# Patient Record
Sex: Female | Born: 1955
Health system: Southern US, Community
[De-identification: ages and names within clinical notes are randomized; demographics above are authoritative.]

## PROBLEM LIST (undated history)

## (undated) DIAGNOSIS — K449 Diaphragmatic hernia without obstruction or gangrene: Secondary | ICD-10-CM

## (undated) DIAGNOSIS — N879 Dysplasia of cervix uteri, unspecified: Secondary | ICD-10-CM

## (undated) DIAGNOSIS — M8580 Other specified disorders of bone density and structure, unspecified site: Secondary | ICD-10-CM

## (undated) HISTORY — DX: Diaphragmatic hernia without obstruction or gangrene: K44.9

## (undated) HISTORY — PX: GYNECOLOGIC CRYOSURGERY: SHX857

## (undated) HISTORY — DX: Other specified disorders of bone density and structure, unspecified site: M85.80

## (undated) HISTORY — DX: Dysplasia of cervix uteri, unspecified: N87.9

---

## 1990-09-22 DIAGNOSIS — N879 Dysplasia of cervix uteri, unspecified: Secondary | ICD-10-CM

## 1990-09-22 HISTORY — PX: CERVICAL BIOPSY  W/ LOOP ELECTRODE EXCISION: SUR135

## 1990-09-22 HISTORY — DX: Dysplasia of cervix uteri, unspecified: N87.9

## 1998-05-25 ENCOUNTER — Other Ambulatory Visit: Admission: RE | Admit: 1998-05-25 | Discharge: 1998-05-25 | Payer: Self-pay | Admitting: Gynecology

## 1999-08-08 ENCOUNTER — Other Ambulatory Visit: Admission: RE | Admit: 1999-08-08 | Discharge: 1999-08-08 | Payer: Self-pay | Admitting: Gynecology

## 2001-10-21 ENCOUNTER — Other Ambulatory Visit: Admission: RE | Admit: 2001-10-21 | Discharge: 2001-10-21 | Payer: Self-pay | Admitting: Gynecology

## 2002-11-21 ENCOUNTER — Other Ambulatory Visit: Admission: RE | Admit: 2002-11-21 | Discharge: 2002-11-21 | Payer: Self-pay | Admitting: Gynecology

## 2004-06-19 ENCOUNTER — Other Ambulatory Visit: Admission: RE | Admit: 2004-06-19 | Discharge: 2004-06-19 | Payer: Self-pay | Admitting: Gynecology

## 2005-06-20 ENCOUNTER — Other Ambulatory Visit: Admission: RE | Admit: 2005-06-20 | Discharge: 2005-06-20 | Payer: Self-pay | Admitting: Gynecology

## 2006-07-13 ENCOUNTER — Other Ambulatory Visit: Admission: RE | Admit: 2006-07-13 | Discharge: 2006-07-13 | Payer: Self-pay | Admitting: Gynecology

## 2007-07-21 ENCOUNTER — Other Ambulatory Visit: Admission: RE | Admit: 2007-07-21 | Discharge: 2007-07-21 | Payer: Self-pay | Admitting: Gynecology

## 2008-07-28 ENCOUNTER — Other Ambulatory Visit: Admission: RE | Admit: 2008-07-28 | Discharge: 2008-07-28 | Payer: Self-pay | Admitting: Gynecology

## 2008-07-28 ENCOUNTER — Encounter: Payer: Self-pay | Admitting: Gynecology

## 2008-07-28 ENCOUNTER — Ambulatory Visit: Payer: Self-pay | Admitting: Gynecology

## 2009-10-08 ENCOUNTER — Other Ambulatory Visit: Admission: RE | Admit: 2009-10-08 | Discharge: 2009-10-08 | Payer: Self-pay | Admitting: Gynecology

## 2009-10-08 ENCOUNTER — Ambulatory Visit: Payer: Self-pay | Admitting: Gynecology

## 2011-01-23 ENCOUNTER — Encounter (INDEPENDENT_AMBULATORY_CARE_PROVIDER_SITE_OTHER): Payer: BC Managed Care – PPO | Admitting: Gynecology

## 2011-01-23 ENCOUNTER — Other Ambulatory Visit (HOSPITAL_COMMUNITY)
Admission: RE | Admit: 2011-01-23 | Discharge: 2011-01-23 | Disposition: A | Discharge status: Home / Self Care | Payer: BC Managed Care – PPO | Source: Ambulatory Visit | Attending: Gynecology | Admitting: Gynecology

## 2011-01-23 ENCOUNTER — Other Ambulatory Visit: Payer: Self-pay | Admitting: Gynecology

## 2011-01-23 DIAGNOSIS — R823 Hemoglobinuria: Secondary | ICD-10-CM

## 2011-01-23 DIAGNOSIS — Z833 Family history of diabetes mellitus: Secondary | ICD-10-CM

## 2011-01-23 DIAGNOSIS — Z13220 Encounter for screening for lipoid disorders: Secondary | ICD-10-CM

## 2011-01-23 DIAGNOSIS — Z124 Encounter for screening for malignant neoplasm of cervix: Secondary | ICD-10-CM | POA: Insufficient documentation

## 2011-01-23 DIAGNOSIS — Z01419 Encounter for gynecological examination (general) (routine) without abnormal findings: Secondary | ICD-10-CM

## 2011-01-30 ENCOUNTER — Encounter (INDEPENDENT_AMBULATORY_CARE_PROVIDER_SITE_OTHER): Payer: BC Managed Care – PPO

## 2011-01-30 DIAGNOSIS — M899 Disorder of bone, unspecified: Secondary | ICD-10-CM

## 2011-01-30 DIAGNOSIS — M949 Disorder of cartilage, unspecified: Secondary | ICD-10-CM

## 2011-12-05 ENCOUNTER — Encounter: Payer: Self-pay | Admitting: Gynecology

## 2012-01-20 ENCOUNTER — Encounter: Payer: Self-pay | Admitting: Obstetrics and Gynecology

## 2012-01-20 DIAGNOSIS — M8580 Other specified disorders of bone density and structure, unspecified site: Secondary | ICD-10-CM | POA: Insufficient documentation

## 2012-01-26 ENCOUNTER — Encounter: Payer: Self-pay | Admitting: Gynecology

## 2012-01-26 ENCOUNTER — Ambulatory Visit (INDEPENDENT_AMBULATORY_CARE_PROVIDER_SITE_OTHER): Payer: BC Managed Care – PPO | Admitting: Gynecology

## 2012-01-26 VITALS — BP 116/62 | Ht 67.0 in | Wt 136.0 lb

## 2012-01-26 DIAGNOSIS — M949 Disorder of cartilage, unspecified: Secondary | ICD-10-CM

## 2012-01-26 DIAGNOSIS — Z13220 Encounter for screening for lipoid disorders: Secondary | ICD-10-CM

## 2012-01-26 DIAGNOSIS — M8580 Other specified disorders of bone density and structure, unspecified site: Secondary | ICD-10-CM

## 2012-01-26 DIAGNOSIS — Z01419 Encounter for gynecological examination (general) (routine) without abnormal findings: Secondary | ICD-10-CM

## 2012-01-26 DIAGNOSIS — Z131 Encounter for screening for diabetes mellitus: Secondary | ICD-10-CM

## 2012-01-26 DIAGNOSIS — IMO0002 Reserved for concepts with insufficient information to code with codable children: Secondary | ICD-10-CM

## 2012-01-26 DIAGNOSIS — M899 Disorder of bone, unspecified: Secondary | ICD-10-CM

## 2012-01-26 DIAGNOSIS — N952 Postmenopausal atrophic vaginitis: Secondary | ICD-10-CM

## 2012-01-26 LAB — COMPREHENSIVE METABOLIC PANEL
ALT: 17 U/L (ref 0–35)
AST: 24 U/L (ref 0–37)
Albumin: 4.2 g/dL (ref 3.5–5.2)
Alkaline Phosphatase: 66 U/L (ref 39–117)
BUN: 18 mg/dL (ref 6–23)
CO2: 30 mEq/L (ref 19–32)
Calcium: 9.3 mg/dL (ref 8.4–10.5)
Chloride: 104 mEq/L (ref 96–112)
Creat: 0.7 mg/dL (ref 0.50–1.10)
Glucose, Bld: 102 mg/dL — ABNORMAL HIGH (ref 70–99)
Potassium: 4.1 mEq/L (ref 3.5–5.3)
Sodium: 140 mEq/L (ref 135–145)
Total Bilirubin: 0.6 mg/dL (ref 0.3–1.2)
Total Protein: 6.6 g/dL (ref 6.0–8.3)

## 2012-01-26 LAB — LIPID PANEL
Cholesterol: 162 mg/dL (ref 0–200)
HDL: 68 mg/dL (ref >39)
LDL Cholesterol: 82 mg/dL (ref 0–99)
Total CHOL/HDL Ratio: 2.4 Ratio
Triglycerides: 60 mg/dL (ref <150)
VLDL: 12 mg/dL (ref 0–40)

## 2012-01-26 LAB — CBC WITH DIFFERENTIAL/PLATELET
Basophils Absolute: 0 10*3/uL (ref 0.0–0.1)
Basophils Relative: 0 % (ref 0–1)
Eosinophils Absolute: 0.2 10*3/uL (ref 0.0–0.7)
Eosinophils Relative: 3 % (ref 0–5)
HCT: 38 % (ref 36.0–46.0)
Hemoglobin: 11.8 g/dL — ABNORMAL LOW (ref 12.0–15.0)
Lymphocytes Relative: 30 % (ref 12–46)
Lymphs Abs: 2.1 10*3/uL (ref 0.7–4.0)
MCH: 26.1 pg (ref 26.0–34.0)
MCHC: 31.1 g/dL (ref 30.0–36.0)
MCV: 84.1 fL (ref 78.0–100.0)
Monocytes Absolute: 0.6 10*3/uL (ref 0.1–1.0)
Monocytes Relative: 8 % (ref 3–12)
Neutro Abs: 4.2 10*3/uL (ref 1.7–7.7)
Neutrophils Relative %: 59 % (ref 43–77)
Platelets: 301 10*3/uL (ref 150–400)
RBC: 4.52 MIL/uL (ref 3.87–5.11)
RDW: 14 % (ref 11.5–15.5)
WBC: 7.1 10*3/uL (ref 4.0–10.5)

## 2012-01-26 NOTE — Patient Instructions (Signed)
Arrange colonoscopy and possible endoscopy per our discussion. Follow up in one year for annual gynecologic exam. Follow up sooner if discomfort with intercourse continues.

## 2012-01-26 NOTE — Progress Notes (Signed)
Ashley Schultz [birth year] [id number]        56 y.o.  for annual exam.  Several issues noted below.  Past medical history,surgical history, medications, allergies, family history and social history were all reviewed and documented in the EPIC chart. ROS:  Was performed and pertinent positives and negatives are included in the history.  Exam: [facility] chaperone present Filed Vitals:   01/26/12 0903  BP: 116/62   General appearance  Normal Skin grossly normal Head/Neck normal with no cervical or supraclavicular adenopathy thyroid normal Lungs  clear Cardiac RR, without RMG Abdominal  soft, nontender, without masses, organomegaly or hernia Breasts  examined lying and sitting without masses, retractions, discharge or axillary adenopathy. Pelvic  Ext/BUS/vagina  normal with atrophic changes  Cervix  normal with atrophic changes  Uterus  anteverted, normal size, shape and contour, midline and mobile nontender   Adnexa  Without masses or tenderness    Anus and perineum  normal   Rectovaginal  normal sphincter tone without palpated masses or tenderness.    Assessment/Plan:  56 y.o. female for annual exam.    1. Postmenopausal.  Patient was having hot flashes/night sweats last year when she came for her annual exam. We discussed options and she was going to try Vivelle/Prometrium but ultimately decided against this and never started it. Said that her hot flashes are manageable. I reviewed the options of OTC soy based products and she can go ahead and try these. I again discussed the issues of HRT to include the WHI study with the risks of stroke, heart attack, DVT as well as breast cancer risk. Lowest dose for shortest period of time recommendations both from ACOG and NAMS reviewed. She is having some dyspareunia. Options for this to include lubricants, moisturizers such as Replens as well as vaginal estrogen treatment reviewed. The absorption issue discussed with availability of Vagifem to  minimize absorption. Patient does not want to start this at this time. She wants to try lubricants. If this does not help then she'll try Vagifem accepting the possibilities of low level absorption, uterine stimulation as well as the WHI risks if significant absorption. 2. Pap smear. 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 LEEP pathology showed CIN-1 with clear margins. Her Pap smears have all been negative since then on an annual basis. Her last Pap smear 2012. I discussed current screening guidelines in no Pap smear was done today and we'll plan on less frequent screening at a 3-5 year interval. 3. Mammography. Patient had her mammogram this year. She'll continue with annual mammography. SBE monthly reviewed. 4. Osteopenia. DEXA 2012 showed T score -1.2. Increase calcium vitamin D reviewed. We'll check vitamin D level today. Plan repeat DEXA next year. 5. Colonoscopy. Recommend screening colonoscopy. She is in the process of arranging this. She does have some dysphagia with eating and I do recommend she discuss this with her gastroenterologist before her colonoscopy in the event that they want to do an endoscopy at the same time. She agrees to arrange this also. 6. Health maintenance. Baseline CBC lipid profile competence metabolic panel urinalysis and hepatitis C screen based on age per CDC recommendation ordered.  Assuming she does well from a gynecologic standpoint she will see me in a year, sooner as needed.    Dara Lords MD, 9:42 AM 01/26/2012

## 2012-01-27 LAB — URINALYSIS W MICROSCOPIC + REFLEX CULTURE
Bacteria, UA: NONE SEEN
Bilirubin Urine: NEGATIVE
Casts: NONE SEEN
Crystals: NONE SEEN
Glucose, UA: NEGATIVE mg/dL
Hgb urine dipstick: NEGATIVE
Ketones, ur: NEGATIVE mg/dL
Leukocytes, UA: NEGATIVE
Nitrite: NEGATIVE
Protein, ur: NEGATIVE mg/dL
Specific Gravity, Urine: 1.016 (ref 1.005–1.030)
Squamous Epithelial / LPF: NONE SEEN
Urobilinogen, UA: 0.2 mg/dL (ref 0.0–1.0)
pH: 6 (ref 5.0–8.0)

## 2012-01-27 LAB — HEPATITIS C ANTIBODY: HCV Ab: NEGATIVE

## 2012-01-27 LAB — VITAMIN D 25 HYDROXY (VIT D DEFICIENCY, FRACTURES): Vit D, 25-Hydroxy: 46 ng/mL (ref 30–89)

## 2013-03-09 ENCOUNTER — Encounter: Payer: Self-pay | Admitting: Gynecology

## 2013-04-07 ENCOUNTER — Encounter: Payer: Self-pay | Admitting: Gynecology

## 2013-04-07 ENCOUNTER — Ambulatory Visit (INDEPENDENT_AMBULATORY_CARE_PROVIDER_SITE_OTHER): Payer: BC Managed Care – PPO | Admitting: Gynecology

## 2013-04-07 VITALS — BP 112/74 | Ht 67.0 in | Wt 142.0 lb

## 2013-04-07 DIAGNOSIS — N951 Menopausal and female climacteric states: Secondary | ICD-10-CM

## 2013-04-07 DIAGNOSIS — Z01419 Encounter for gynecological examination (general) (routine) without abnormal findings: Secondary | ICD-10-CM

## 2013-04-07 DIAGNOSIS — M899 Disorder of bone, unspecified: Secondary | ICD-10-CM

## 2013-04-07 DIAGNOSIS — M949 Disorder of cartilage, unspecified: Secondary | ICD-10-CM

## 2013-04-07 DIAGNOSIS — M8580 Other specified disorders of bone density and structure, unspecified site: Secondary | ICD-10-CM

## 2013-04-07 DIAGNOSIS — Z13220 Encounter for screening for lipoid disorders: Secondary | ICD-10-CM

## 2013-04-07 MED ORDER — PROGESTERONE MICRONIZED 100 MG PO CAPS: 100.0000 mg | ORAL_CAPSULE | Freq: Every day | ORAL | Status: DC

## 2013-04-07 MED ORDER — ESTRADIOL 0.05 MG/24HR TD PTTW: 1.0000 | MEDICATED_PATCH | TRANSDERMAL | Status: DC

## 2013-04-07 NOTE — Patient Instructions (Signed)
Followup for bone density as scheduled. Start on hormone replacement as we discussed. Call if any issues or questions. Report any vaginal bleeding.

## 2013-04-07 NOTE — Progress Notes (Signed)
Ashley Schultz [birth year] [id number]        57 y.o.  [id number] for annual exam.  Several issues noted below.  Past medical history,surgical history, medications, allergies, family history and social history were all reviewed and documented in the EPIC chart.  ROS:  Performed and pertinent positives and negatives are included in the history, assessment and plan .  Exam: [facility] assistant Filed Vitals:   04/07/13 1208  BP: 112/74  Height: 5\' 7"  (1.702 m)  Weight: 142 lb (64.411 kg)   General appearance  Normal Skin grossly normal Head/Neck normal with no cervical or supraclavicular adenopathy thyroid normal Lungs  clear Cardiac RR, without RMG Abdominal  soft, nontender, without masses, organomegaly or hernia Breasts  examined lying and sitting without masses, retractions, discharge or axillary adenopathy. Pelvic  Ext/BUS/vagina  normal with mild atrophic changes.  Cervix  normal   Uterus  anteverted, normal size, shape and contour, midline and mobile nontender   Adnexa  Without masses or tenderness    Anus and perineum  normal   Rectovaginal  normal sphincter tone without palpated masses or tenderness.    Assessment/Plan:  57 y.o. [id number] female for annual exam.   1. Menopausal symptoms. Patient is continuing to struggle with hot flashes night sweats vaginal dryness. We talked about HRT multiple times and she now decides that she wants to go ahead and try this.  I again reviewed the whole issue of HRT with her to include the WHI study with increased risk of stroke, heart attack, DVT and breast cancer. The ACOG and NAMS statements for lowest dose for the shortest period of time reviewed. Transdermal versus oral first-pass effect benefit discussed. We will initiate with Minivelle 0.05 mg patches and Prometrium 100 mg nightly. She'll followup if she has any issues. She knows to report any vaginal bleeding. 2. Osteopenia. DEXA 01/2011 with T score -1.2. FRAX 4.4%/0.3%. Repeat DEXA now for  2 points on the curve. Increase calcium vitamin D reviewed. 3. Mammography 02/2013. Continued annual mammography. 4. Pap smear 2012. No Pap smear done today. History of LEEP with CIN-1 1992 with normal Pap smears since then. Plan Pap smear next year 3 year interval. 5. Colonoscopy 2013. Followup are recommended interval. 6. Health maintenance. Will check baseline CBC comprehensive metabolic panel lipid profile urinalysis vitamin D TSH. Follow up for bone density as scheduled.  Note: This document was prepared with digital dictation and possible smart phrase technology. Any transcriptional errors that result from this process are unintentional.   Dara Lords MD, 12:33 PM 04/07/2013

## 2013-04-08 LAB — CBC WITH DIFFERENTIAL/PLATELET
Basophils Absolute: 0 10*3/uL (ref 0.0–0.1)
Basophils Relative: 0 % (ref 0–1)
Eosinophils Absolute: 0.1 10*3/uL (ref 0.0–0.7)
Eosinophils Relative: 2 % (ref 0–5)
HCT: 36.6 % (ref 36.0–46.0)
Hemoglobin: 11.8 g/dL — ABNORMAL LOW (ref 12.0–15.0)
Lymphocytes Relative: 44 % (ref 12–46)
Lymphs Abs: 2.6 10*3/uL (ref 0.7–4.0)
MCH: 26.6 pg (ref 26.0–34.0)
MCHC: 32.2 g/dL (ref 30.0–36.0)
MCV: 82.4 fL (ref 78.0–100.0)
Monocytes Absolute: 0.5 10*3/uL (ref 0.1–1.0)
Monocytes Relative: 9 % (ref 3–12)
Neutro Abs: 2.6 10*3/uL (ref 1.7–7.7)
Neutrophils Relative %: 45 % (ref 43–77)
Platelets: 308 10*3/uL (ref 150–400)
RBC: 4.44 MIL/uL (ref 3.87–5.11)
RDW: 13.8 % (ref 11.5–15.5)
WBC: 5.9 10*3/uL (ref 4.0–10.5)

## 2013-04-08 LAB — LIPID PANEL
Cholesterol: 155 mg/dL (ref 0–200)
HDL: 70 mg/dL (ref >39)
LDL Cholesterol: 72 mg/dL (ref 0–99)
Total CHOL/HDL Ratio: 2.2 Ratio
Triglycerides: 64 mg/dL (ref <150)
VLDL: 13 mg/dL (ref 0–40)

## 2013-04-08 LAB — URINALYSIS W MICROSCOPIC + REFLEX CULTURE
Bacteria, UA: NONE SEEN
Bilirubin Urine: NEGATIVE
Casts: NONE SEEN
Glucose, UA: NEGATIVE mg/dL
Hgb urine dipstick: NEGATIVE
Ketones, ur: NEGATIVE mg/dL
Leukocytes, UA: NEGATIVE
Nitrite: NEGATIVE
Protein, ur: NEGATIVE mg/dL
Specific Gravity, Urine: 1.014 (ref 1.005–1.030)
Squamous Epithelial / LPF: NONE SEEN
Urobilinogen, UA: 0.2 mg/dL (ref 0.0–1.0)
pH: 6.5 (ref 5.0–8.0)

## 2013-04-08 LAB — COMPREHENSIVE METABOLIC PANEL
ALT: 13 U/L (ref 0–35)
AST: 17 U/L (ref 0–37)
Albumin: 4.4 g/dL (ref 3.5–5.2)
Alkaline Phosphatase: 80 U/L (ref 39–117)
BUN: 12 mg/dL (ref 6–23)
CO2: 29 mEq/L (ref 19–32)
Calcium: 9.8 mg/dL (ref 8.4–10.5)
Chloride: 103 mEq/L (ref 96–112)
Creat: 0.73 mg/dL (ref 0.50–1.10)
Glucose, Bld: 90 mg/dL (ref 70–99)
Potassium: 4.1 mEq/L (ref 3.5–5.3)
Sodium: 137 mEq/L (ref 135–145)
Total Bilirubin: 0.4 mg/dL (ref 0.3–1.2)
Total Protein: 6.5 g/dL (ref 6.0–8.3)

## 2013-04-08 LAB — VITAMIN D 25 HYDROXY (VIT D DEFICIENCY, FRACTURES): Vit D, 25-Hydroxy: 56 ng/mL (ref 30–89)

## 2013-04-08 LAB — TSH: TSH: 2.715 u[IU]/mL (ref 0.350–4.500)

## 2013-04-22 DIAGNOSIS — M8580 Other specified disorders of bone density and structure, unspecified site: Secondary | ICD-10-CM

## 2013-04-22 HISTORY — DX: Other specified disorders of bone density and structure, unspecified site: M85.80

## 2013-04-29 ENCOUNTER — Ambulatory Visit (INDEPENDENT_AMBULATORY_CARE_PROVIDER_SITE_OTHER): Payer: BC Managed Care – PPO | Admitting: Women's Health

## 2013-04-29 DIAGNOSIS — N898 Other specified noninflammatory disorders of vagina: Secondary | ICD-10-CM

## 2013-04-29 DIAGNOSIS — R3915 Urgency of urination: Secondary | ICD-10-CM

## 2013-04-29 DIAGNOSIS — N390 Urinary tract infection, site not specified: Secondary | ICD-10-CM

## 2013-04-29 LAB — URINALYSIS W MICROSCOPIC + REFLEX CULTURE
Bilirubin Urine: NEGATIVE
Casts: NONE SEEN
Glucose, UA: NEGATIVE mg/dL
Ketones, ur: NEGATIVE mg/dL
Leukocytes, UA: NEGATIVE
Nitrite: NEGATIVE
Protein, ur: NEGATIVE mg/dL
Specific Gravity, Urine: >1.03 — ABNORMAL HIGH (ref 1.005–1.030)
Urobilinogen, UA: 0.2 mg/dL (ref 0.0–1.0)
WBC, UA: NONE SEEN WBC/hpf (ref <3)
pH: 6 (ref 5.0–8.0)

## 2013-04-29 LAB — WET PREP FOR TRICH, YEAST, CLUE
Clue Cells Wet Prep HPF POC: NONE SEEN
Trich, Wet Prep: NONE SEEN
WBC, Wet Prep HPF POC: NONE SEEN
Yeast Wet Prep HPF POC: NONE SEEN

## 2013-04-29 MED ORDER — CIPROFLOXACIN HCL 250 MG PO TABS: 250.0000 mg | ORAL_TABLET | Freq: Two times a day (BID) | ORAL | Status: DC

## 2013-04-29 NOTE — Progress Notes (Signed)
Patient ID: Ashley Schultz, female   DOB: 03-Aug-1956, 57 y.o.   MRN: [id number] Presents with complaint of increased urinary frequency, urgency with mild bloating sensation. Denies a fever. Postmenopausal with numerous symptoms, started on HRT minivelle patch and Prometrium 100 at bedtime 3 weeks ago and has had better rest, no bleeding. Denies vaginal discharge, odor, itching. Husband recently had bypass surgery, is doing better but has been under increased stress. 45 year old daughter is struggling, making poor choices.  Exam: Appears well,  Tearful, no CVAT, external genitalia within normal limits, speculum exam scant white discharge with no noted erythema, wet prep negative. UA positive for moderate blood, 21-50 RBCs and rare bacteria.  Probable UTI with hematuria  Plan: Cipro 250 twice daily for 3 days. Check test of cure UA in 2 weeks. Counseling encouraged for situational stressors.

## 2013-04-29 NOTE — Patient Instructions (Addendum)
Urinary Tract Infection  Urinary tract infections (UTIs) can develop anywhere along your urinary tract. Your urinary tract is your body's drainage system for removing wastes and extra water. Your urinary tract includes two kidneys, two ureters, a bladder, and a urethra. Your kidneys are a pair of bean-shaped organs. Each kidney is about the size of your fist. They are located below your ribs, one on each side of your spine.  CAUSES  Infections are caused by microbes, which are microscopic organisms, including fungi, viruses, and bacteria. These organisms are so small that they can only be seen through a microscope. Bacteria are the microbes that most commonly cause UTIs.  SYMPTOMS   Symptoms of UTIs may vary by age and gender of the patient and by the location of the infection. Symptoms in young women typically include a frequent and intense urge to urinate and a painful, burning feeling in the bladder or urethra during urination. Older women and men are more likely to be tired, shaky, and weak and have muscle aches and abdominal pain. A fever may mean the infection is in your kidneys. Other symptoms of a kidney infection include pain in your back or sides below the ribs, nausea, and vomiting.  DIAGNOSIS  To diagnose a UTI, your caregiver will ask you about your symptoms. Your caregiver also will ask to provide a urine sample. The urine sample will be tested for bacteria and white blood cells. White blood cells are made by your body to help fight infection.  TREATMENT   Typically, UTIs can be treated with medication. Because most UTIs are caused by a bacterial infection, they usually can be treated with the use of antibiotics. The choice of antibiotic and length of treatment depend on your symptoms and the type of bacteria causing your infection.  HOME CARE INSTRUCTIONS   If you were prescribed antibiotics, take them exactly as your caregiver instructs you. Finish the medication even if you feel better after you  have only taken some of the medication.   Drink enough water and fluids to keep your urine clear or pale yellow.   Avoid caffeine, tea, and carbonated beverages. They tend to irritate your bladder.   Empty your bladder often. Avoid holding urine for long periods of time.   Empty your bladder before and after sexual intercourse.   After a bowel movement, women should cleanse from front to back. Use each tissue only once.  SEEK MEDICAL CARE IF:    You have back pain.   You develop a fever.   Your symptoms do not begin to resolve within 3 days.  SEEK IMMEDIATE MEDICAL CARE IF:    You have severe back pain or lower abdominal pain.   You develop chills.   You have nausea or vomiting.   You have continued burning or discomfort with urination.  MAKE SURE YOU:    Understand these instructions.   Will watch your condition.   Will get help right away if you are not doing well or get worse.  Document Released: 06/18/2005 Document Revised: 03/09/2012 Document Reviewed: 10/17/2011  Exit[employer] Patient Information 2014 [employer].

## 2013-04-30 LAB — URINE CULTURE
Colony Count: NO GROWTH
Organism ID, Bacteria: NO GROWTH

## 2013-05-13 ENCOUNTER — Other Ambulatory Visit: Payer: Self-pay | Admitting: *Deleted

## 2013-05-13 ENCOUNTER — Other Ambulatory Visit: Payer: BC Managed Care – PPO

## 2013-05-13 DIAGNOSIS — N390 Urinary tract infection, site not specified: Secondary | ICD-10-CM

## 2013-05-14 LAB — URINALYSIS W MICROSCOPIC + REFLEX CULTURE
Bacteria, UA: NONE SEEN
Bilirubin Urine: NEGATIVE
Casts: NONE SEEN
Glucose, UA: NEGATIVE mg/dL
Hgb urine dipstick: NEGATIVE
Ketones, ur: NEGATIVE mg/dL
Leukocytes, UA: NEGATIVE
Nitrite: NEGATIVE
Protein, ur: NEGATIVE mg/dL
Specific Gravity, Urine: 1.014 (ref 1.005–1.030)
Squamous Epithelial / LPF: NONE SEEN
Urobilinogen, UA: 0.2 mg/dL (ref 0.0–1.0)
pH: 7 (ref 5.0–8.0)

## 2013-05-19 ENCOUNTER — Ambulatory Visit (INDEPENDENT_AMBULATORY_CARE_PROVIDER_SITE_OTHER): Payer: BC Managed Care – PPO

## 2013-05-19 ENCOUNTER — Encounter: Payer: Self-pay | Admitting: Gynecology

## 2013-05-19 DIAGNOSIS — M8580 Other specified disorders of bone density and structure, unspecified site: Secondary | ICD-10-CM

## 2013-05-19 DIAGNOSIS — M899 Disorder of bone, unspecified: Secondary | ICD-10-CM

## 2013-11-07 ENCOUNTER — Telehealth: Payer: Self-pay

## 2013-11-07 NOTE — Telephone Encounter (Signed)
Patient said her mom had heart attack and is scheduled for open heart surgery on Thurs. Meantime her Mom diagnosed with H1N1 flu and now patient with a few slight symptoms. She said they recommended she get Tamiflu since around her mom. She wondered if you would call this in for her to start asap?

## 2013-11-07 NOTE — Telephone Encounter (Signed)
I did call patient back and let her know Dr. Velvet BatheF has left the office and not sure if/when he will receive this message. Patient might should go to Urgent Care Walk [facility] and let them prescribe.  Her cell phone is [phone].

## 2013-11-09 ENCOUNTER — Other Ambulatory Visit: Payer: Self-pay | Admitting: Gynecology

## 2013-11-09 MED ORDER — OSELTAMIVIR PHOSPHATE 75 MG PO CAPS: 75.0000 mg | ORAL_CAPSULE | Freq: Every day | ORAL | Status: DC

## 2013-11-09 NOTE — Telephone Encounter (Signed)
I called patient back. She is well and not having sx. She would like to get Rx called in since she was around her mom on Saturday and she needs to stay well. Rx sent.

## 2013-11-09 NOTE — Telephone Encounter (Signed)
If patient is having no symptoms at all and she can take Tamiflu 75 mg daily x10 days as a prophylaxis.

## 2013-11-09 NOTE — Telephone Encounter (Signed)
I closed this in error.   What should I recommend to patient today as this call was on Monday evening. Too late for Tamiflu?

## 2014-03-29 ENCOUNTER — Encounter: Payer: Self-pay | Admitting: Gynecology

## 2014-06-08 ENCOUNTER — Other Ambulatory Visit (HOSPITAL_COMMUNITY)
Admission: RE | Admit: 2014-06-08 | Discharge: 2014-06-08 | Disposition: A | Discharge status: Home / Self Care | Payer: BC Managed Care – PPO | Source: Ambulatory Visit | Attending: Gynecology | Admitting: Gynecology

## 2014-06-08 ENCOUNTER — Ambulatory Visit (INDEPENDENT_AMBULATORY_CARE_PROVIDER_SITE_OTHER): Payer: BC Managed Care – PPO | Admitting: Gynecology

## 2014-06-08 ENCOUNTER — Encounter: Payer: Self-pay | Admitting: Gynecology

## 2014-06-08 VITALS — BP 120/76 | Ht 67.5 in | Wt 147.0 lb

## 2014-06-08 DIAGNOSIS — Z01419 Encounter for gynecological examination (general) (routine) without abnormal findings: Secondary | ICD-10-CM

## 2014-06-08 DIAGNOSIS — M8580 Other specified disorders of bone density and structure, unspecified site: Secondary | ICD-10-CM

## 2014-06-08 DIAGNOSIS — Z1151 Encounter for screening for human papillomavirus (HPV): Secondary | ICD-10-CM | POA: Diagnosis present

## 2014-06-08 DIAGNOSIS — M949 Disorder of cartilage, unspecified: Secondary | ICD-10-CM

## 2014-06-08 DIAGNOSIS — M899 Disorder of bone, unspecified: Secondary | ICD-10-CM

## 2014-06-08 DIAGNOSIS — N952 Postmenopausal atrophic vaginitis: Secondary | ICD-10-CM

## 2014-06-08 LAB — LIPID PANEL
Cholesterol: 176 mg/dL (ref 0–200)
HDL: 70 mg/dL (ref >39)
LDL Cholesterol: 93 mg/dL (ref 0–99)
Total CHOL/HDL Ratio: 2.5 Ratio
Triglycerides: 66 mg/dL (ref <150)
VLDL: 13 mg/dL (ref 0–40)

## 2014-06-08 LAB — CBC WITH DIFFERENTIAL/PLATELET
Basophils Absolute: 0 10*3/uL (ref 0.0–0.1)
Basophils Relative: 0 % (ref 0–1)
Eosinophils Absolute: 0.2 10*3/uL (ref 0.0–0.7)
Eosinophils Relative: 3 % (ref 0–5)
HCT: 39.2 % (ref 36.0–46.0)
Hemoglobin: 12.8 g/dL (ref 12.0–15.0)
Lymphocytes Relative: 40 % (ref 12–46)
Lymphs Abs: 2.5 10*3/uL (ref 0.7–4.0)
MCH: 27.1 pg (ref 26.0–34.0)
MCHC: 32.7 g/dL (ref 30.0–36.0)
MCV: 82.9 fL (ref 78.0–100.0)
Monocytes Absolute: 0.7 10*3/uL (ref 0.1–1.0)
Monocytes Relative: 11 % (ref 3–12)
Neutro Abs: 2.9 10*3/uL (ref 1.7–7.7)
Neutrophils Relative %: 46 % (ref 43–77)
Platelets: 332 10*3/uL (ref 150–400)
RBC: 4.73 MIL/uL (ref 3.87–5.11)
RDW: 13.7 % (ref 11.5–15.5)
WBC: 6.3 10*3/uL (ref 4.0–10.5)

## 2014-06-08 LAB — COMPREHENSIVE METABOLIC PANEL
ALT: 19 U/L (ref 0–35)
AST: 23 U/L (ref 0–37)
Albumin: 4.4 g/dL (ref 3.5–5.2)
Alkaline Phosphatase: 69 U/L (ref 39–117)
BUN: 18 mg/dL (ref 6–23)
CO2: 27 mEq/L (ref 19–32)
Calcium: 9.4 mg/dL (ref 8.4–10.5)
Chloride: 100 mEq/L (ref 96–112)
Creat: 0.66 mg/dL (ref 0.50–1.10)
Glucose, Bld: 85 mg/dL (ref 70–99)
Potassium: 4.8 mEq/L (ref 3.5–5.3)
Sodium: 136 mEq/L (ref 135–145)
Total Bilirubin: 0.4 mg/dL (ref 0.2–1.2)
Total Protein: 7 g/dL (ref 6.0–8.3)

## 2014-06-08 NOTE — Progress Notes (Signed)
Ashley Schultz [birth year] [id number]        58 y.o.  [id number] for annual exam.  Several issues noted below.  Past medical history,surgical history, problem list, medications, allergies, family history and social history were all reviewed and documented as reviewed in the EPIC chart.  ROS:  12 system ROS performed with pertinent positives and negatives included in the history, assessment and plan.   Additional significant findings :  none   Exam: [facility] person Vitals:   06/08/14 1005  BP: 120/76  Height: 5' 7.5" (1.715 m)  Weight: 147 lb (66.679 kg)   General appearance:  Normal affect, orientation and appearance. Skin: Grossly normal HEENT: Without gross lesions.  No cervical or supraclavicular adenopathy. Thyroid normal.  Lungs:  Clear without wheezing, rales or rhonchi Cardiac: RR, without RMG Abdominal:  Soft, nontender, without masses, guarding, rebound, organomegaly or hernia Breasts:  Examined lying and sitting without masses, retractions, discharge or axillary adenopathy. Pelvic:  Ext/BUS/vagina with generalized atrophic changes  Cervix atrophic. Pap/HPV  Uterus anteverted, normal size, shape and contour, midline and mobile nontender   Adnexa  Without masses or tenderness    Anus and perineum  Normal   Rectovaginal  Normal sphincter tone without palpated masses or tenderness.    Assessment/Plan:  58 y.o. [id number] female for annual exam.   1. Postmenopausal/atrophic genital changes. Had transiently started HRT discontinued because she decided against it. She is having some menopausal symptoms but tolerating them and does not want to consider HRT. She is having issues as far as dyspareunia due to atrophic changes and is using lubricants which again is tolerated. Alternatives to include vaginal estrogen/Osphena rejected. No vaginal bleeding. Continue to monitor. Follow up if wants to rediscuss treatment options otherwise report any vaginal bleeding. 2. Osteopenia.   DEXA 04/2013 T score -1.2 FRAX 5.6%/0.4%. Stable from prior DEXA. Plan repeat in several years. Increased calcium vitamin D reviewed.  Check vitamin D level today. 3. Pap smear 2012. Pap/HPV today. 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 Plan repeat Pap smear at 3-5 year interval current screening guidelines of normal. 4. Mammography 03/2014. Continue with annual mammography. SBE monthly reviewed. 5. Colonoscopy 2013. Repeat at their recommended interval. 6. Health maintenance. Baseline CBC comprehensive metabolic panel lipid profile urinalysis TSH vitamin D ordered. Follow up one year, sooner as needed.     Dara Lords MD, 10:34 AM 06/08/2014

## 2014-06-08 NOTE — Addendum Note (Signed)
Addended by: Dayna Barker on: 06/08/2014 10:39 AM   Modules accepted: Orders

## 2014-06-08 NOTE — Patient Instructions (Signed)
You may obtain a copy of any labs that were done today by logging onto MyChart as outlined in the instructions provided with your AVS (after visit summary). The office will not call with normal lab results but certainly if there are any significant abnormalities then we will contact you.   Health Maintenance, Female A healthy lifestyle and preventative care can promote health and wellness.  Maintain regular health, dental, and eye exams.  Eat a healthy diet. Foods like vegetables, fruits, whole grains, low-fat dairy products, and lean protein foods contain the nutrients you need without too many calories. Decrease your intake of foods high in solid fats, added sugars, and salt. Get information about a proper diet from your caregiver, if necessary.  Regular physical exercise is one of the most important things you can do for your health. Most adults should get at least 150 minutes of moderate-intensity exercise (any activity that increases your heart rate and causes you to sweat) each week. In addition, most adults need muscle-strengthening exercises on 2 or more days a week.   Maintain a healthy weight. The body mass index (BMI) is a screening tool to identify possible weight problems. It provides an estimate of body fat based on height and weight. Your caregiver can help determine your BMI, and can help you achieve or maintain a healthy weight. For adults 20 years and older:  A BMI below 18.5 is considered underweight.  A BMI of 18.5 to 24.9 is normal.  A BMI of 25 to 29.9 is considered overweight.  A BMI of 30 and above is considered obese.  Maintain normal blood lipids and cholesterol by exercising and minimizing your intake of saturated fat. Eat a balanced diet with plenty of fruits and vegetables. Blood tests for lipids and cholesterol should begin at age 61 and be repeated every 5 years. If your lipid or cholesterol levels are high, you are over 50, or you are a high risk for heart  disease, you may need your cholesterol levels checked more frequently.Ongoing high lipid and cholesterol levels should be treated with medicines if diet and exercise are not effective.  If you smoke, find out from your caregiver how to quit. If you do not use tobacco, do not start.  Lung cancer screening is recommended for adults aged 33 80 years who are at high risk for developing lung cancer because of a history of smoking. Yearly low-dose computed tomography (CT) is recommended for people who have at least a 30-pack-year history of smoking and are a current smoker or have quit within the past 15 years. A pack year of smoking is smoking an average of 1 pack of cigarettes a day for 1 year (for example: 1 pack a day for 30 years or 2 packs a day for 15 years). Yearly screening should continue until the smoker has stopped smoking for at least 15 years. Yearly screening should also be stopped for people who develop a health problem that would prevent them from having lung cancer treatment.  If you are pregnant, do not drink alcohol. If you are breastfeeding, be very cautious about drinking alcohol. If you are not pregnant and choose to drink alcohol, do not exceed 1 drink per day. One drink is considered to be 12 ounces (355 mL) of beer, 5 ounces (148 mL) of wine, or 1.5 ounces (44 mL) of liquor.  Avoid use of street drugs. Do not share needles with anyone. Ask for help if you need support or instructions about stopping  the use of drugs.  High blood pressure causes heart disease and increases the risk of stroke. Blood pressure should be checked at least every 1 to 2 years. Ongoing high blood pressure should be treated with medicines, if weight loss and exercise are not effective.  If you are 59 to 56 years old, ask your caregiver if you should take aspirin to prevent strokes.  Diabetes screening involves taking a blood sample to check your fasting blood sugar level. This should be done once every 3  years, after age 91, if you are within normal weight and without risk factors for diabetes. Testing should be considered at a younger age or be carried out more frequently if you are overweight and have at least 1 risk factor for diabetes.  Breast cancer screening is essential preventative care for women. You should practice "breast self-awareness." This means understanding the normal appearance and feel of your breasts and may include breast self-examination. Any changes detected, no matter how small, should be reported to a caregiver. Women in their 66s and 30s should have a clinical breast exam (CBE) by a caregiver as part of a regular health exam every 1 to 3 years. After age 101, women should have a CBE every year. Starting at age 100, women should consider having a mammogram (breast X-ray) every year. Women who have a family history of breast cancer should talk to their caregiver about genetic screening. Women at a high risk of breast cancer should talk to their caregiver about having an MRI and a mammogram every year.  Breast cancer gene (BRCA)-related cancer risk assessment is recommended for women who have family members with BRCA-related cancers. BRCA-related cancers include breast, ovarian, tubal, and peritoneal cancers. Having family members with these cancers may be associated with an increased risk for harmful changes (mutations) in the breast cancer genes BRCA1 and BRCA2. Results of the assessment will determine the need for genetic counseling and BRCA1 and BRCA2 testing.  The Pap test is a screening test for cervical cancer. Women should have a Pap test starting at age 57. Between ages 25 and 35, Pap tests should be repeated every 2 years. Beginning at age 37, you should have a Pap test every 3 years as long as the past 3 Pap tests have been normal. If you had a hysterectomy for a problem that was not cancer or a condition that could lead to cancer, then you no longer need Pap tests. If you are  between ages 50 and 76, and you have had normal Pap tests going back 10 years, you no longer need Pap tests. If you have had past treatment for cervical cancer or a condition that could lead to cancer, you need Pap tests and screening for cancer for at least 20 years after your treatment. If Pap tests have been discontinued, risk factors (such as a new sexual partner) need to be reassessed to determine if screening should be resumed. Some women have medical problems that increase the chance of getting cervical cancer. In these cases, your caregiver may recommend more frequent screening and Pap tests.  The human papillomavirus (HPV) test is an additional test that may be used for cervical cancer screening. The HPV test looks for the virus that can cause the cell changes on the cervix. The cells collected during the Pap test can be tested for HPV. The HPV test could be used to screen women aged 44 years and older, and should be used in women of any age  who have unclear Pap test results. After the age of 30, women should have HPV testing at the same frequency as a Pap test.  Colorectal cancer can be detected and often prevented. Most routine colorectal cancer screening begins at the age of 50 and continues through age 75. However, your caregiver may recommend screening at an earlier age if you have risk factors for colon cancer. On a yearly basis, your caregiver may provide home test kits to check for hidden blood in the stool. Use of a small camera at the end of a tube, to directly examine the colon (sigmoidoscopy or colonoscopy), can detect the earliest forms of colorectal cancer. Talk to your caregiver about this at age 50, when routine screening begins. Direct examination of the colon should be repeated every 5 to 10 years through age 75, unless early forms of pre-cancerous polyps or small growths are found.  Hepatitis C blood testing is recommended for all people born from 1945 through 1965 and any  individual with known risks for hepatitis C.  Practice safe sex. Use condoms and avoid high-risk sexual practices to reduce the spread of sexually transmitted infections (STIs). Sexually active women aged 25 and younger should be checked for Chlamydia, which is a common sexually transmitted infection. Older women with new or multiple partners should also be tested for Chlamydia. Testing for other STIs is recommended if you are sexually active and at increased risk.  Osteoporosis is a disease in which the bones lose minerals and strength with aging. This can result in serious bone fractures. The risk of osteoporosis can be identified using a bone density scan. Women ages 65 and over and women at risk for fractures or osteoporosis should discuss screening with their caregivers. Ask your caregiver whether you should be taking a calcium supplement or vitamin D to reduce the rate of osteoporosis.  Menopause can be associated with physical symptoms and risks. Hormone replacement therapy is available to decrease symptoms and risks. You should talk to your caregiver about whether hormone replacement therapy is right for you.  Use sunscreen. Apply sunscreen liberally and repeatedly throughout the day. You should seek shade when your shadow is shorter than you. Protect yourself by wearing long sleeves, pants, a wide-brimmed hat, and sunglasses year round, whenever you are outdoors.  Notify your caregiver of new moles or changes in moles, especially if there is a change in shape or color. Also notify your caregiver if a mole is larger than the size of a pencil eraser.  Stay current with your immunizations. Document Released: 03/24/2011 Document Revised: 01/03/2013 Document Reviewed: 03/24/2011 [employer] Patient Information 2014 [employer].   

## 2014-06-09 LAB — URINALYSIS W MICROSCOPIC + REFLEX CULTURE
Bilirubin Urine: NEGATIVE
Casts: NONE SEEN
Crystals: NONE SEEN
Glucose, UA: NEGATIVE mg/dL
Hgb urine dipstick: NEGATIVE
Ketones, ur: NEGATIVE mg/dL
Nitrite: NEGATIVE
Protein, ur: NEGATIVE mg/dL
Specific Gravity, Urine: 1.019 (ref 1.005–1.030)
Squamous Epithelial / LPF: NONE SEEN
Urobilinogen, UA: 0.2 mg/dL (ref 0.0–1.0)
pH: 7 (ref 5.0–8.0)

## 2014-06-09 LAB — CYTOLOGY - PAP

## 2014-06-09 LAB — TSH: TSH: 2.265 u[IU]/mL (ref 0.350–4.500)

## 2014-06-09 LAB — VITAMIN D 25 HYDROXY (VIT D DEFICIENCY, FRACTURES): Vit D, 25-Hydroxy: 48 ng/mL (ref 30–89)

## 2014-06-11 LAB — URINE CULTURE: Colony Count: 75000

## 2014-06-12 ENCOUNTER — Other Ambulatory Visit: Payer: Self-pay | Admitting: Gynecology

## 2014-06-12 MED ORDER — SULFAMETHOXAZOLE-TMP DS 800-160 MG PO TABS: 1.0000 | ORAL_TABLET | Freq: Two times a day (BID) | ORAL | Status: DC

## 2014-07-18 ENCOUNTER — Encounter: Payer: BC Managed Care – PPO | Admitting: Gynecology

## 2014-07-20 ENCOUNTER — Encounter: Payer: Self-pay | Admitting: Gynecology

## 2014-07-20 ENCOUNTER — Ambulatory Visit (INDEPENDENT_AMBULATORY_CARE_PROVIDER_SITE_OTHER): Payer: BC Managed Care – PPO | Admitting: Gynecology

## 2014-07-20 DIAGNOSIS — Z23 Encounter for immunization: Secondary | ICD-10-CM

## 2014-07-20 DIAGNOSIS — N898 Other specified noninflammatory disorders of vagina: Secondary | ICD-10-CM

## 2014-07-20 LAB — URINALYSIS W MICROSCOPIC + REFLEX CULTURE
Bilirubin Urine: NEGATIVE
Casts: NONE SEEN
Crystals: NONE SEEN
Glucose, UA: NEGATIVE mg/dL
Ketones, ur: NEGATIVE mg/dL
Leukocytes, UA: NEGATIVE
Nitrite: NEGATIVE
Protein, ur: NEGATIVE mg/dL
Specific Gravity, Urine: 1.015 (ref 1.005–1.030)
Urobilinogen, UA: 0.2 mg/dL (ref 0.0–1.0)
WBC, UA: NONE SEEN WBC/hpf (ref <3)
pH: 7.5 (ref 5.0–8.0)

## 2014-07-20 LAB — WET PREP FOR TRICH, YEAST, CLUE
Clue Cells Wet Prep HPF POC: NONE SEEN
Trich, Wet Prep: NONE SEEN
Yeast Wet Prep HPF POC: NONE SEEN

## 2014-07-20 MED ORDER — FLUCONAZOLE 150 MG PO TABS: 150.0000 mg | ORAL_TABLET | Freq: Once | ORAL | Status: DC

## 2014-07-20 NOTE — Addendum Note (Signed)
Addended by: Dayna BarkerGARDNER, KIMBERLY K on: 07/20/2014 03:27 PM   Modules accepted: Orders

## 2014-07-20 NOTE — Progress Notes (Signed)
Ashley Schultz [birth year] [id number]        58 y.o.  [id number] Presents with several days of vaginal itching." Around the opening to the vagina. No significant discharge odor. Recently treated for low level asymptomatic bacteriuria. Not having any frequency dysuria urgency or low back pain.  Past medical history,surgical history, problem list, medications, allergies, family history and social history were all reviewed and documented in the EPIC chart.  Directed ROS with pertinent positives and negatives documented in the history of present illness/assessment and plan.  Exam: Kim assistant General appearance:  Normal Spine without CVA tenderness Abdomen soft nontender without masses guarding rebound Pelvic external BS vagina with atrophic changes, scant discharge. Cervix normal. Uterus normal size and mobile nontender. Adnexa without masses or tenderness  Assessment/Plan:  58 y.o. [id number] with vulvar irritation times several days. Exam shows some atrophic changes but no significant discharge. Wet prep is negative. We'll cover for low-level yeast with Diflucan 150 mg 1 dose. Urinalysis is negative but will follow up with culture just to make sure that her asymptomatic bacteriuria has cleared. Follow up if symptoms persist, worsen or recur.     Dara LordsFONTAINE,Tj Kitchings P MD, 3:05 PM 07/20/2014

## 2014-07-20 NOTE — Patient Instructions (Signed)
Take the one Diflucan pill. Call if your symptoms persist. 

## 2014-07-22 LAB — URINE CULTURE
Colony Count: NO GROWTH
Organism ID, Bacteria: NO GROWTH

## 2014-07-24 ENCOUNTER — Encounter: Payer: Self-pay | Admitting: Gynecology

## 2015-07-30 ENCOUNTER — Encounter: Payer: Self-pay | Admitting: Gynecology

## 2015-08-27 ENCOUNTER — Encounter: Payer: Self-pay | Admitting: Gynecology

## 2015-08-27 ENCOUNTER — Ambulatory Visit (INDEPENDENT_AMBULATORY_CARE_PROVIDER_SITE_OTHER): Payer: 59 | Admitting: Gynecology

## 2015-08-27 VITALS — BP 120/70 | Ht 67.0 in | Wt 150.0 lb

## 2015-08-27 DIAGNOSIS — Z01419 Encounter for gynecological examination (general) (routine) without abnormal findings: Secondary | ICD-10-CM | POA: Diagnosis not present

## 2015-08-27 DIAGNOSIS — Z1321 Encounter for screening for nutritional disorder: Secondary | ICD-10-CM

## 2015-08-27 DIAGNOSIS — Z1329 Encounter for screening for other suspected endocrine disorder: Secondary | ICD-10-CM

## 2015-08-27 DIAGNOSIS — Z13220 Encounter for screening for lipoid disorders: Secondary | ICD-10-CM | POA: Diagnosis not present

## 2015-08-27 LAB — COMPREHENSIVE METABOLIC PANEL
ALT: 14 U/L (ref 6–29)
AST: 17 U/L (ref 10–35)
Albumin: 4.3 g/dL (ref 3.6–5.1)
Alkaline Phosphatase: 75 U/L (ref 33–130)
BUN: 14 mg/dL (ref 7–25)
CO2: 31 mmol/L (ref 20–31)
Calcium: 9.2 mg/dL (ref 8.6–10.4)
Chloride: 98 mmol/L (ref 98–110)
Creat: 0.7 mg/dL (ref 0.50–1.05)
Glucose, Bld: 88 mg/dL (ref 65–99)
Potassium: 4.1 mmol/L (ref 3.5–5.3)
Sodium: 137 mmol/L (ref 135–146)
Total Bilirubin: 0.5 mg/dL (ref 0.2–1.2)
Total Protein: 6.7 g/dL (ref 6.1–8.1)

## 2015-08-27 LAB — CBC WITH DIFFERENTIAL/PLATELET
Basophils Absolute: 0 10*3/uL (ref 0.0–0.1)
Basophils Relative: 0 % (ref 0–1)
Eosinophils Absolute: 0.1 10*3/uL (ref 0.0–0.7)
Eosinophils Relative: 2 % (ref 0–5)
HCT: 39.5 % (ref 36.0–46.0)
Hemoglobin: 12.6 g/dL (ref 12.0–15.0)
Lymphocytes Relative: 40 % (ref 12–46)
Lymphs Abs: 2.4 10*3/uL (ref 0.7–4.0)
MCH: 26.6 pg (ref 26.0–34.0)
MCHC: 31.9 g/dL (ref 30.0–36.0)
MCV: 83.3 fL (ref 78.0–100.0)
MPV: 9 fL (ref 8.6–12.4)
Monocytes Absolute: 0.7 10*3/uL (ref 0.1–1.0)
Monocytes Relative: 11 % (ref 3–12)
Neutro Abs: 2.9 10*3/uL (ref 1.7–7.7)
Neutrophils Relative %: 47 % (ref 43–77)
Platelets: 390 10*3/uL (ref 150–400)
RBC: 4.74 MIL/uL (ref 3.87–5.11)
RDW: 13.6 % (ref 11.5–15.5)
WBC: 6.1 10*3/uL (ref 4.0–10.5)

## 2015-08-27 LAB — LIPID PANEL
Cholesterol: 162 mg/dL (ref 125–200)
HDL: 77 mg/dL (ref >=46)
LDL Cholesterol: 61 mg/dL (ref <130)
Total CHOL/HDL Ratio: 2.1 Ratio (ref <=5.0)
Triglycerides: 119 mg/dL (ref <150)
VLDL: 24 mg/dL (ref <30)

## 2015-08-27 LAB — TSH: TSH: 2.373 u[IU]/mL (ref 0.350–4.500)

## 2015-08-27 NOTE — Patient Instructions (Signed)

## 2015-08-27 NOTE — Progress Notes (Signed)
Ashley Schultz 1955-12-11 [id number]        59 y.o.  [id number]  No LMP recorded. Patient is postmenopausal. for annual exam. Doing well without complaints.  Past medical history,surgical history, problem list, medications, allergies, family history and social history were all reviewed and documented as reviewed in the EPIC chart.  ROS:  Performed with pertinent positives and negatives included in the history, assessment and plan.   Additional significant findings :  none   Exam: [facility] personassistant Filed Vitals:   08/27/15 1438  BP: 120/70  Height: 5\' 7"  (1.702 m)  Weight: 150 lb (68.04 kg)   General appearance:  Normal affect, orientation and appearance. Skin: Grossly normal HEENT: Without gross lesions.  No cervical or supraclavicular adenopathy. Thyroid normal.  Lungs:  Clear without wheezing, rales or rhonchi Cardiac: RR, without RMG Abdominal:  Soft, nontender, without masses, guarding, rebound, organomegaly or hernia Breasts:  Examined lying and sitting without masses, retractions, discharge or axillary adenopathy. Pelvic:  Ext/BUS/vagina normal with mild atrophic changes  Cervix normal with mild atrophic changes  Uterus anteverted, normal size, shape and contour, midline and mobile nontender   Adnexa  Without masses or tenderness    Anus and perineum  Normal   Rectovaginal  Normal sphincter tone without palpated masses or tenderness.    Assessment/Plan:  59 y.o. [name] female for annual exam.   1. Postmenopausal/mild atrophic changes. Without significant polyposis, night sweats, vaginal dryness or any vaginal bleeding. Continue to monitor and report any vaginal bleeding. 2. Osteopenia. DEXA 2014 T score -1.2 FRAX 5.6%/0.4%. Stable from prior DEXA. Check vitamin D level today. Plan repeat DEXA in several years. 3. Pap smear/HPV 2015 negative. No Pap smear done today.  History of LEEP for LGSIL 1992. Normal Pap smears since then. 4. Mammography 07/2015. Continue with annual  mammography. SBE monthly review. 5. Colonoscopy 2013. Repeat at their recommended interval. 6. Health maintenance. Baseline CBC, comprehensive metabolic panel, lipid profile, urinalysis, TSH, vitamin D ordered. Follow up in one year, sooner as needed.   Dara LordsFONTAINE,TIMOTHY P MD, 3:07 PM 08/27/2015

## 2015-08-28 LAB — URINALYSIS W MICROSCOPIC + REFLEX CULTURE
Bacteria, UA: NONE SEEN [HPF]
Bilirubin Urine: NEGATIVE
Casts: NONE SEEN [LPF]
Glucose, UA: NEGATIVE
Hgb urine dipstick: NEGATIVE
Ketones, ur: NEGATIVE
Leukocytes, UA: NEGATIVE
Nitrite: NEGATIVE
Protein, ur: NEGATIVE
RBC / HPF: NONE SEEN RBC/HPF (ref <=2)
Specific Gravity, Urine: 1.013 (ref 1.001–1.035)
Squamous Epithelial / LPF: NONE SEEN [HPF] (ref <=5)
WBC, UA: NONE SEEN WBC/HPF (ref <=5)
Yeast: NONE SEEN [HPF]
pH: 6.5 (ref 5.0–8.0)

## 2015-08-28 LAB — VITAMIN D 25 HYDROXY (VIT D DEFICIENCY, FRACTURES): Vit D, 25-Hydroxy: 34 ng/mL (ref 30–100)

## 2015-09-20 ENCOUNTER — Encounter: Payer: Self-pay | Admitting: Gynecology

## 2015-09-26 ENCOUNTER — Encounter: Payer: Self-pay | Admitting: Gynecology

## 2015-09-26 ENCOUNTER — Ambulatory Visit (INDEPENDENT_AMBULATORY_CARE_PROVIDER_SITE_OTHER): Payer: 59 | Admitting: Gynecology

## 2015-09-26 VITALS — BP 120/74

## 2015-09-26 DIAGNOSIS — L298 Other pruritus: Secondary | ICD-10-CM | POA: Diagnosis not present

## 2015-09-26 DIAGNOSIS — N898 Other specified noninflammatory disorders of vagina: Secondary | ICD-10-CM

## 2015-09-26 DIAGNOSIS — N3000 Acute cystitis without hematuria: Secondary | ICD-10-CM

## 2015-09-26 LAB — URINALYSIS W MICROSCOPIC + REFLEX CULTURE
Bilirubin Urine: NEGATIVE
Casts: NONE SEEN [LPF]
Crystals: NONE SEEN [HPF]
Glucose, UA: NEGATIVE
Ketones, ur: NEGATIVE
Leukocytes, UA: NEGATIVE
Nitrite: NEGATIVE
Protein, ur: NEGATIVE
Specific Gravity, Urine: 1.01 (ref 1.001–1.035)
Yeast: NONE SEEN [HPF]
pH: 7 (ref 5.0–8.0)

## 2015-09-26 LAB — WET PREP FOR TRICH, YEAST, CLUE
Trich, Wet Prep: NONE SEEN
Yeast Wet Prep HPF POC: NONE SEEN

## 2015-09-26 MED ORDER — FLUCONAZOLE 150 MG PO TABS: 150.0000 mg | ORAL_TABLET | Freq: Once | ORAL | Status: DC

## 2015-09-26 MED ORDER — SULFAMETHOXAZOLE-TRIMETHOPRIM 800-160 MG PO TABS: 1.0000 | ORAL_TABLET | Freq: Two times a day (BID) | ORAL | Status: DC

## 2015-09-26 NOTE — Addendum Note (Signed)
Addended by: Dayna BarkerGARDNER, KIMBERLY K on: 09/26/2015 12:52 PM   Modules accepted: Orders

## 2015-09-26 NOTE — Patient Instructions (Signed)
Take the sulfa antibiotic twice daily for 3 days. Follow up if your symptoms persist, worsen or recur.  Take the yeast pill once.

## 2015-09-26 NOTE — Progress Notes (Signed)
Jonetta Osgoodamela P Beilfuss [birth year] [id number]        60 y.o.  [id number] presents with 2-3 days of urinary frequency. Also some mild vaginal irritation with itching.  No discharge or odor. No dysuria, urgency, low back pain, fever or chills.  Past medical history,surgical history, problem list, medications, allergies, family history and social history were all reviewed and documented in the EPIC chart.  Directed ROS with pertinent positives and negatives documented in the history of present illness/assessment and plan.  Exam: Kennon PortelaKim Gardner assistant Filed Vitals:   09/26/15 1215  BP: 120/74   General appearance:  Normal Abdomen soft nontender without masses guarding rebound Pelvic external BUS vagina with atrophic changes. Scant discharge. Cervix normal. Uterus normal size midline mobile nontender. Adnexa without masses or tenderness.  Assessment/Plan:  60 y.o. [name] with history and urinalysis consistent with early UTI. Will treat with Septra DS 1 by mouth twice a day 3 days. Wet prep was negative but I'm going to cover her for yeast given the symptoms with Diflucan 150 mg 1 dose. Follow up if symptoms persist, worsen or recur.    Dara LordsFONTAINE,Jessamyn Watterson P MD, 12:44 PM 09/26/2015

## 2015-09-27 LAB — URINE CULTURE
Colony Count: NO GROWTH
Organism ID, Bacteria: NO GROWTH

## 2015-09-28 ENCOUNTER — Other Ambulatory Visit: Payer: Self-pay | Admitting: Gynecology

## 2015-09-28 DIAGNOSIS — N3000 Acute cystitis without hematuria: Secondary | ICD-10-CM

## 2015-10-15 ENCOUNTER — Other Ambulatory Visit: Payer: 59

## 2015-10-15 DIAGNOSIS — N3000 Acute cystitis without hematuria: Secondary | ICD-10-CM

## 2015-10-16 LAB — URINALYSIS W MICROSCOPIC + REFLEX CULTURE
Bacteria, UA: NONE SEEN [HPF]
Bilirubin Urine: NEGATIVE
Casts: NONE SEEN [LPF]
Crystals: NONE SEEN [HPF]
Glucose, UA: NEGATIVE
Hgb urine dipstick: NEGATIVE
Ketones, ur: NEGATIVE
Leukocytes, UA: NEGATIVE
Nitrite: NEGATIVE
Protein, ur: NEGATIVE
RBC / HPF: NONE SEEN RBC/HPF (ref <=2)
Specific Gravity, Urine: 1.015 (ref 1.001–1.035)
Squamous Epithelial / LPF: NONE SEEN [HPF] (ref <=5)
WBC, UA: NONE SEEN WBC/HPF (ref <=5)
Yeast: NONE SEEN [HPF]
pH: 7 (ref 5.0–8.0)

## 2015-10-17 ENCOUNTER — Telehealth: Payer: Self-pay | Admitting: *Deleted

## 2015-10-17 NOTE — Telephone Encounter (Signed)
Pt was prescribed Septra DS 1 by mouth twice a day 3 days and Diflucan tablet 150 x 1 dose on OV 09/26/15. Completed both Rx, rechecked urine on 10/15/15 results are normal, pt said in the evening she feels irritation and vaginal itching, no severe, comes and goes, but only in evening. Pt was going to watch for now and follow up if needed, but wanted me to run this by you as well for your thoughts. Please advise

## 2015-10-17 NOTE — Telephone Encounter (Signed)
Call if continues through this week

## 2015-10-17 NOTE — Telephone Encounter (Signed)
Pt informed

## 2016-07-30 ENCOUNTER — Encounter: Payer: Self-pay | Admitting: Gynecology

## 2016-08-27 ENCOUNTER — Ambulatory Visit (INDEPENDENT_AMBULATORY_CARE_PROVIDER_SITE_OTHER): Payer: 59 | Admitting: Gynecology

## 2016-08-27 ENCOUNTER — Encounter: Payer: Self-pay | Admitting: Gynecology

## 2016-08-27 VITALS — BP 118/74 | Ht 67.5 in | Wt 152.0 lb

## 2016-08-27 DIAGNOSIS — M8580 Other specified disorders of bone density and structure, unspecified site: Secondary | ICD-10-CM | POA: Diagnosis not present

## 2016-08-27 DIAGNOSIS — N952 Postmenopausal atrophic vaginitis: Secondary | ICD-10-CM

## 2016-08-27 DIAGNOSIS — Z1329 Encounter for screening for other suspected endocrine disorder: Secondary | ICD-10-CM | POA: Diagnosis not present

## 2016-08-27 DIAGNOSIS — Z01411 Encounter for gynecological examination (general) (routine) with abnormal findings: Secondary | ICD-10-CM

## 2016-08-27 LAB — CBC WITH DIFFERENTIAL/PLATELET
Basophils Absolute: 0 cells/uL (ref 0–200)
Basophils Relative: 0 %
Eosinophils Absolute: 186 cells/uL (ref 15–500)
Eosinophils Relative: 3 %
HCT: 40.8 % (ref 35.0–45.0)
Hemoglobin: 12.9 g/dL (ref 11.7–15.5)
Lymphocytes Relative: 43 %
Lymphs Abs: 2666 cells/uL (ref 850–3900)
MCH: 26.7 pg — ABNORMAL LOW (ref 27.0–33.0)
MCHC: 31.6 g/dL — ABNORMAL LOW (ref 32.0–36.0)
MCV: 84.5 fL (ref 80.0–100.0)
MPV: 8.8 fL (ref 7.5–12.5)
Monocytes Absolute: 496 cells/uL (ref 200–950)
Monocytes Relative: 8 %
Neutro Abs: 2852 cells/uL (ref 1500–7800)
Neutrophils Relative %: 46 %
Platelets: 331 10*3/uL (ref 140–400)
RBC: 4.83 MIL/uL (ref 3.80–5.10)
RDW: 13.4 % (ref 11.0–15.0)
WBC: 6.2 10*3/uL (ref 3.8–10.8)

## 2016-08-27 LAB — COMPREHENSIVE METABOLIC PANEL
ALT: 11 U/L (ref 6–29)
AST: 17 U/L (ref 10–35)
Albumin: 4.3 g/dL (ref 3.6–5.1)
Alkaline Phosphatase: 70 U/L (ref 33–130)
BUN: 15 mg/dL (ref 7–25)
CO2: 25 mmol/L (ref 20–31)
Calcium: 9.4 mg/dL (ref 8.6–10.4)
Chloride: 101 mmol/L (ref 98–110)
Creat: 0.65 mg/dL (ref 0.50–1.05)
Glucose, Bld: 95 mg/dL (ref 65–99)
Potassium: 4 mmol/L (ref 3.5–5.3)
Sodium: 137 mmol/L (ref 135–146)
Total Bilirubin: 0.4 mg/dL (ref 0.2–1.2)
Total Protein: 7 g/dL (ref 6.1–8.1)

## 2016-08-27 LAB — TSH: TSH: 2.62 mIU/L

## 2016-08-27 NOTE — Progress Notes (Signed)
    Ashley Osgoodamela P Schultz 05-04-1956 [id number]        60 y.o.  [id number]  for annual exam.    Past medical history,surgical history, problem list, medications, allergies, family history and social history were all reviewed and documented as reviewed in the EPIC chart.  ROS:  Performed with pertinent positives and negatives included in the history, assessment and plan.   Additional significant findings :  None   Exam: Kennon PortelaKim Gardner assistant Vitals:   08/27/16 1203  BP: 118/74  Weight: 152 lb (68.9 kg)  Height: 5' 7.5" (1.715 m)   Body mass index is 23.46 kg/m.  General appearance:  Normal affect, orientation and appearance. Skin: Grossly normal HEENT: Without gross lesions.  No cervical or supraclavicular adenopathy. Thyroid normal.  Lungs:  Clear without wheezing, rales or rhonchi Cardiac: RR, without RMG Abdominal:  Soft, nontender, without masses, guarding, rebound, organomegaly or hernia Breasts:  Examined lying and sitting without masses, retractions, discharge or axillary adenopathy. Pelvic:  Ext, BUS, Vagina normal with atrophic changes  Cervix normal  Uterus anteverted, normal size, shape and contour, midline and mobile nontender   Adnexa without masses or tenderness    Anus and perineum normal   Rectovaginal normal sphincter tone without palpated masses or tenderness.    Assessment/Plan:  60 y.o. [name] female for annual exam.   1. Postmenopausal/atrophic genital changes. No significant hot flushes, night sweats, vaginal dryness or any vaginal bleeding. Continue monitor report any issues or vaginal bleeding. 2. Mammography 07/2016. Continue with annual mammography when due. SBE monthly reviewed. 3. Osteopenia. DEXA 2014 T score -1.2 FRAX 5.6%/0.4%. Stable from prior DEXA. Will plan repeat over the next year or 2. Check vitamin D level today. 4. Pap smear/HPV 2015 negative. No Pap smear done today. History of LEEP for LGSIL 1992. Normal Paps afterwards. 5. Colonoscopy  2013. Believes she is due next year and is going to check. 6. Health maintenance. Baseline CBC, CMP, urinalysis, TSH and vitamin D ordered. Lipid profile last year great cholesterol 162 HDL 77 LDL 61. Did not repeat this year. Follow up in one year, sooner as needed.   Dara LordsFONTAINE,TIMOTHY P MD, 12:26 PM 08/27/2016

## 2016-08-27 NOTE — Patient Instructions (Signed)

## 2016-08-28 LAB — URINALYSIS W MICROSCOPIC + REFLEX CULTURE
Bacteria, UA: NONE SEEN [HPF]
Bilirubin Urine: NEGATIVE
Casts: NONE SEEN [LPF]
Crystals: NONE SEEN [HPF]
Glucose, UA: NEGATIVE
Hgb urine dipstick: NEGATIVE
Ketones, ur: NEGATIVE
Leukocytes, UA: NEGATIVE
Nitrite: NEGATIVE
Protein, ur: NEGATIVE
Specific Gravity, Urine: 1.013 (ref 1.001–1.035)
Squamous Epithelial / LPF: NONE SEEN [HPF] (ref <=5)
WBC, UA: NONE SEEN WBC/HPF (ref <=5)
Yeast: NONE SEEN [HPF]
pH: 7.5 (ref 5.0–8.0)

## 2016-08-28 LAB — VITAMIN D 25 HYDROXY (VIT D DEFICIENCY, FRACTURES): Vit D, 25-Hydroxy: 43 ng/mL (ref 30–100)

## 2016-08-29 LAB — URINE CULTURE: Organism ID, Bacteria: NO GROWTH

## 2017-01-08 ENCOUNTER — Encounter (HOSPITAL_BASED_OUTPATIENT_CLINIC_OR_DEPARTMENT_OTHER): Payer: Self-pay | Admitting: *Deleted

## 2017-01-08 ENCOUNTER — Emergency Department (HOSPITAL_BASED_OUTPATIENT_CLINIC_OR_DEPARTMENT_OTHER)
Admission: EM | Admit: 2017-01-08 | Discharge: 2017-01-08 | Disposition: A | Discharge status: Home / Self Care | Payer: 59 | Attending: Emergency Medicine | Admitting: Emergency Medicine

## 2017-01-08 ENCOUNTER — Emergency Department (HOSPITAL_BASED_OUTPATIENT_CLINIC_OR_DEPARTMENT_OTHER): Payer: 59

## 2017-01-08 DIAGNOSIS — R1084 Generalized abdominal pain: Secondary | ICD-10-CM | POA: Insufficient documentation

## 2017-01-08 DIAGNOSIS — Z87891 Personal history of nicotine dependence: Secondary | ICD-10-CM | POA: Insufficient documentation

## 2017-01-08 DIAGNOSIS — R55 Syncope and collapse: Secondary | ICD-10-CM | POA: Diagnosis not present

## 2017-01-08 LAB — COMPREHENSIVE METABOLIC PANEL
ALT: 16 U/L (ref 14–54)
AST: 23 U/L (ref 15–41)
Albumin: 4.1 g/dL (ref 3.5–5.0)
Alkaline Phosphatase: 63 U/L (ref 38–126)
Anion gap: 10 (ref 5–15)
BUN: 19 mg/dL (ref 6–20)
CO2: 25 mmol/L (ref 22–32)
Calcium: 9.3 mg/dL (ref 8.9–10.3)
Chloride: 104 mmol/L (ref 101–111)
Creatinine, Ser: 0.63 mg/dL (ref 0.44–1.00)
GFR calc Af Amer: >60 mL/min (ref >60)
GFR calc non Af Amer: >60 mL/min (ref >60)
Glucose, Bld: 123 mg/dL — ABNORMAL HIGH (ref 65–99)
Potassium: 3.4 mmol/L — ABNORMAL LOW (ref 3.5–5.1)
Sodium: 139 mmol/L (ref 135–145)
Total Bilirubin: 0.5 mg/dL (ref 0.3–1.2)
Total Protein: 6.9 g/dL (ref 6.5–8.1)

## 2017-01-08 LAB — URINALYSIS, ROUTINE W REFLEX MICROSCOPIC
Bilirubin Urine: NEGATIVE
Glucose, UA: NEGATIVE mg/dL
Hgb urine dipstick: NEGATIVE
Ketones, ur: NEGATIVE mg/dL
Leukocytes, UA: NEGATIVE
Nitrite: NEGATIVE
Protein, ur: 30 mg/dL — AB
Specific Gravity, Urine: 1.016 (ref 1.005–1.030)
pH: 7.5 (ref 5.0–8.0)

## 2017-01-08 LAB — CBC WITH DIFFERENTIAL/PLATELET
Basophils Absolute: 0 10*3/uL (ref 0.0–0.1)
Basophils Relative: 1 %
Eosinophils Absolute: 0.2 10*3/uL (ref 0.0–0.7)
Eosinophils Relative: 3 %
HCT: 37.4 % (ref 36.0–46.0)
Hemoglobin: 12.3 g/dL (ref 12.0–15.0)
Lymphocytes Relative: 41 %
Lymphs Abs: 2.6 10*3/uL (ref 0.7–4.0)
MCH: 27.6 pg (ref 26.0–34.0)
MCHC: 32.9 g/dL (ref 30.0–36.0)
MCV: 83.9 fL (ref 78.0–100.0)
Monocytes Absolute: 0.6 10*3/uL (ref 0.1–1.0)
Monocytes Relative: 9 %
Neutro Abs: 2.9 10*3/uL (ref 1.7–7.7)
Neutrophils Relative %: 46 %
Platelets: 309 10*3/uL (ref 150–400)
RBC: 4.46 MIL/uL (ref 3.87–5.11)
RDW: 13.7 % (ref 11.5–15.5)
WBC: 6.2 10*3/uL (ref 4.0–10.5)

## 2017-01-08 LAB — CBG MONITORING, ED: Glucose-Capillary: 110 mg/dL — ABNORMAL HIGH (ref 65–99)

## 2017-01-08 LAB — URINALYSIS, MICROSCOPIC (REFLEX): Squamous Epithelial / LPF: NONE SEEN

## 2017-01-08 LAB — TROPONIN I: Troponin I: <0.03 ng/mL (ref <0.03)

## 2017-01-08 LAB — LIPASE, BLOOD: Lipase: 26 U/L (ref 11–51)

## 2017-01-08 MED ORDER — ONDANSETRON HCL 4 MG PO TABS: 4.0000 mg | ORAL_TABLET | Freq: Four times a day (QID) | ORAL | 0 refills | Status: DC

## 2017-01-08 MED ORDER — ONDANSETRON HCL 4 MG/2ML IJ SOLN
4.0000 mg | Freq: Once | INTRAMUSCULAR | Status: AC
  Administered 2017-01-08: 4 mg via INTRAVENOUS
  Filled 2017-01-08: qty 2

## 2017-01-08 MED ORDER — SODIUM CHLORIDE 0.9 % IV BOLUS (SEPSIS)
1000.0000 mL | Freq: Once | INTRAVENOUS | Status: AC
  Administered 2017-01-08: 1000 mL via INTRAVENOUS

## 2017-01-08 MED FILL — ONDANSETRON HCL 4 MG TABLET: 4 | 4 days supply | Qty: 12 | Fill #0

## 2017-01-08 NOTE — ED Triage Notes (Signed)
Patient states she got up this morning and walked her dogs.  States when she returned home she went to the bathroom and developed crampy abdominal pain and had a soft bm.  States she suddenly got hot and fainted falling off of the toilet on to the bathroom floor.  States she had a positive loc for approximately 4 minutes per her husband.  States her husband picked her up and put her in the bed.  States while driving to the ED she began to have heaviness in her chest.  Patient states she has pain in her tongue, and extreme fatigue.  Spouse states he felt like she had a seizure because her hand was contracted when he found her on the floor.

## 2017-01-08 NOTE — ED Provider Notes (Signed)
[facility] Note   CSN: [id number] Arrival date & time: 01/08/17  0803     History   Chief Complaint Chief Complaint  Patient presents with  . Loss of Consciousness    HPI Ashley Schultz is a 61 y.o. female.  Patient is a 61 year old female with no significant medical history except for a history of frequent vasovagal events with vomiting in the past presents today after having an episode of loss of consciousness. Patient states when she woke up this morning she felt her normal self. She went to the bathroom and urinated but then started having abdominal cramping and had a soft bowel movement. She then stated she had another urge to have a second bowel movement and that's when she became hot and flushed. She states everything started spinning and her husband heard a crash. He found her lying on the bathroom floor unresponsive. He is estimating she was unconscious for about 4 minutes. He states she then took a deep gasp and started screaming. While she was unconscious he did note her left hand was contracted and twitching. She screamed for several minutes which the patient does remember. When she stopped screaming she complained of feeling tired and weak all over. On the way to the hospital she describes some mild tightness in the lower part of her chest which seems to be worse with taking deep breaths. She denies any shortness of breath. She felt normal yesterday she had one glass of wine which is not unusual but did note last night she was not able to urinate but was able to urinate this morning without any dysuria frequency or urgency. She currently takes no medications. With her syncopal events in the past they've usually only been related to vomiting and she has never had any episodes of twitching or screaming in the past. Currently her complaint is feeling generally weak, twitchy and having mild diffuse abd pain.   The history is provided by the patient and the  spouse.    Past Medical History:  Diagnosis Date  . Cervical dysplasia 1992   CIN I  . Osteopenia 04/2013   T score -1.2 FRAX 5.6%/0.4%    Patient Active Problem List   Diagnosis Date Noted  . Osteopenia     Past Surgical History:  Procedure Laterality Date  . CERVICAL BIOPSY  W/ LOOP ELECTRODE EXCISION  1992   CIN I  . GYNECOLOGIC CRYOSURGERY      OB History    Gravida Para Term Preterm AB Living   SAB TAB Ectopic Multiple Live Births                   Home Medications    Prior to Admission medications   Medication Sig Start Date End Date Taking? Authorizing Provider  Ascorbic Acid (VITAMIN C PO) Take by mouth.   Yes Historical Provider, MD  CALCIUM PO Take by mouth.   Yes Historical Provider, MD  Cetirizine HCl (ZYRTEC PO) Take by mouth.   Yes Historical Provider, MD  Cholecalciferol (VITAMIN D PO) Take by mouth.   Yes Historical Provider, MD  IRON PO Take by mouth.   Yes Historical Provider, MD  Multiple Vitamin (MULTIVITAMIN) capsule Take 1 capsule by mouth daily.   Yes Historical Provider, MD  VITAMIN E PO Take by mouth.   Yes Historical Provider, MD  MELATONIN PO Take by mouth.    Historical Provider, MD  Family History Family History  Problem Relation Age of Onset  . Pancreatic cancer Father   . Lung cancer Paternal Grandfather   . Heart attack Mother     Social History Social History  Substance Use Topics  . Smoking status: Former Games developer  . Smokeless tobacco: Never Used  . Alcohol use 1.8 oz/week    3 Standard drinks or equivalent per week     Allergies   Codeine; Darvon [propoxyphene hcl]; and Penicillins   Review of Systems Review of Systems  All other systems reviewed and are negative.    Physical Exam Updated Vital Signs BP 136/81 (BP Location: Right Arm)   Pulse 72   Resp 20   Ht 5' 7.5" (1.715 m)   Wt 150 lb (68 kg)   SpO2 100%   BMI 23.15 kg/m   Physical Exam  Constitutional: She is oriented to  person, place, and time. She appears well-developed and well-nourished. No distress.  HENT:  Head: Normocephalic and atraumatic.  Mouth/Throat: Oropharynx is clear and moist.  Eyes: Conjunctivae and EOM are normal. Pupils are equal, round, and reactive to light.  Neck: Normal range of motion. Neck supple.  Cardiovascular: Normal rate, regular rhythm and intact distal pulses.   No murmur heard. Pulmonary/Chest: Effort normal and breath sounds normal. No respiratory distress. She has no wheezes. She has no rales.  Abdominal: Soft. She exhibits no distension. There is tenderness. There is no rebound and no guarding.  Mild diffuse tenderness without rebound or guarding  Musculoskeletal: Normal range of motion. She exhibits no edema or tenderness.  Neurological: She is alert and oriented to person, place, and time. No cranial nerve deficit or sensory deficit.  4/5 strength in all ext.  Is able to lift legs off the bed but takes significant effort  Skin: Skin is warm and dry. No rash noted. No erythema.  Psychiatric: She has a normal mood and affect. Her behavior is normal.  Nursing note and vitals reviewed.    ED Treatments / Results  Labs (all labs ordered are listed, but only abnormal results are displayed) Labs Reviewed  COMPREHENSIVE METABOLIC PANEL - Abnormal; Notable for the following:       Result Value   Potassium 3.4 (*)    Glucose, Bld 123 (*)    All other components within normal limits  URINALYSIS, ROUTINE W REFLEX MICROSCOPIC - Abnormal; Notable for the following:    APPearance TURBID (*)    Protein, ur 30 (*)    All other components within normal limits  URINALYSIS, MICROSCOPIC (REFLEX) - Abnormal; Notable for the following:    Bacteria, UA RARE (*)    All other components within normal limits  CBG MONITORING, ED - Abnormal; Notable for the following:    Glucose-Capillary 110 (*)    All other components within normal limits  CBC WITH DIFFERENTIAL/PLATELET  LIPASE,  BLOOD  TROPONIN I    EKG  EKG Interpretation  Date/Time:  Thursday January 08 2017 08:07:45 EDT Ventricular Rate:  64 PR Interval:    QRS Duration: 95 QT Interval:  449 QTC Calculation: 464 R Axis:   92 Text Interpretation:  Sinus rhythm Prolonged PR interval Probable anterolateral infarct, old No previous tracing Confirmed by Anitra Lauth  MD, Berlie Hatchel ([id number]) on 01/08/2017 8:17:10 AM       Radiology Dg Chest 2 View  Result Date: 01/08/2017 CLINICAL DATA:  Triple-fainting episode this morning. Chest heaviness on the way to the ER. The patient is experiencing weakness and nausea  now. EXAM: CHEST  2 VIEW COMPARISON:  None in PACs FINDINGS: The lungs are mildly hyperinflated with hemidiaphragm flattening. There is no focal infiltrate. There is no pleural effusion. The heart and pulmonary vascularity are normal. There is biapical pleural thickening. There is gentle dextrocurvature centered at the mid thoracic spine. IMPRESSION: Mild hyperinflation consistent COPD in the patient's smoking history. No pneumonia, CHF, nor other acute cardiopulmonary abnormality. Electronically Signed   By: David  [place] M.D.   On: 01/08/2017 09:23   Ct Head Wo Contrast  Result Date: 01/08/2017 CLINICAL DATA:  Syncope while using the restroom this morning. Initial encounter. EXAM: CT HEAD WITHOUT CONTRAST TECHNIQUE: Contiguous axial images were obtained from the base of the skull through the vertex without intravenous contrast. COMPARISON:  None. FINDINGS: Brain: The brain appears normal without hemorrhage, infarct, mass lesion, mass effect, midline shift or abnormal extra-axial fluid collection. No hydrocephalus or pneumocephalus. Vascular: Negative. Skull: Intact. Sinuses/Orbits: Minimal ethmoid air cell disease on the left is identified. The right maxillary sinus is somewhat hypoplastic. Other: None. IMPRESSION: Negative head CT. Electronically Signed   By: Drusilla Kanner M.D.   On: 01/08/2017 09:21     Procedures Procedures (including critical care time)  Medications Ordered in ED Medications  sodium chloride 0.9 % bolus 1,000 mL (not administered)     Initial Impression / Assessment and Plan / ED Course  I have reviewed the triage vital signs and the nursing notes.  Pertinent labs & imaging results that were available during my care of the patient were reviewed by me and considered in my medical decision making (see chart for details).    Patient presenting today with an episode of loss of consciousness. Seems most likely it was a vasovagal event related to abdominal cramping and bowel movement however some atypical features such as screaming, hand contracture and persistent generalized weakness. Possibility for seizure. Will do a head CT to rule out mass or bleed. Patient does not have significant medical problems. Vital signs are within normal limits here with heart rate less than 100 and blood pressure between 122-136/80. Patient's oxygen saturation is 100%. She is ill appearing on exam has a normal blood sugar 110. She has mild diffuse abdominal pain which may be related to an acute abdominal process but no focal symptoms suggestive of appendicitis. Low suspicion for dehydration. Possibly UTI given urinary symptoms however seems unlikely. Potential GYN pathology.  EKG here shows prolonged PR but otherwise within normal limits. Low suspicion that this is ACS or dysrhythmia cause of her symptoms.  Head CT, chest x-ray, CBC, CMP, lipase, UA, troponin pending. Patient given IV fluids and will reevaluate.  10:06 AM Labs and imaging are reassuring. After 1 L of normal saline patient looks much better. She is feeling more her normal self. Feel most likely this was a vasovagal event.  11:10 AM UA wnl.  Pt was able to walk to the bathroom and just c/o of feeling tired.  Will d/c home. Final Clinical Impressions(s) / ED Diagnoses   Final diagnoses:  Syncope and collapse    New  Prescriptions New Prescriptions   ONDANSETRON (ZOFRAN) 4 MG TABLET    Take 1 tablet (4 mg total) by mouth every 6 (six) hours.     Gwyneth Sprout, MD 01/08/17 [phone]

## 2018-08-04 ENCOUNTER — Encounter: Payer: Self-pay | Admitting: Gynecology

## 2018-08-11 ENCOUNTER — Ambulatory Visit (INDEPENDENT_AMBULATORY_CARE_PROVIDER_SITE_OTHER): Payer: 59 | Admitting: Gynecology

## 2018-08-11 ENCOUNTER — Encounter: Payer: Self-pay | Admitting: Gynecology

## 2018-08-11 VITALS — BP 118/76 | Ht 67.0 in | Wt 154.0 lb

## 2018-08-11 DIAGNOSIS — Z13220 Encounter for screening for lipoid disorders: Secondary | ICD-10-CM

## 2018-08-11 DIAGNOSIS — M8580 Other specified disorders of bone density and structure, unspecified site: Secondary | ICD-10-CM

## 2018-08-11 DIAGNOSIS — Z1329 Encounter for screening for other suspected endocrine disorder: Secondary | ICD-10-CM

## 2018-08-11 DIAGNOSIS — Z1321 Encounter for screening for nutritional disorder: Secondary | ICD-10-CM

## 2018-08-11 DIAGNOSIS — Z01419 Encounter for gynecological examination (general) (routine) without abnormal findings: Secondary | ICD-10-CM | POA: Diagnosis not present

## 2018-08-11 DIAGNOSIS — N952 Postmenopausal atrophic vaginitis: Secondary | ICD-10-CM

## 2018-08-11 DIAGNOSIS — Z1151 Encounter for screening for human papillomavirus (HPV): Secondary | ICD-10-CM

## 2018-08-11 NOTE — Patient Instructions (Signed)
Return for bone density as scheduled and fasting blood work.

## 2018-08-11 NOTE — Progress Notes (Signed)
    Jonetta Osgoodamela P Davidson [birth year] [id number]        62 y.o.  [id number] for annual gynecologic exam.  Without gynecologic complaints.  Past medical history,surgical history, problem list, medications, allergies, family history and social history were all reviewed and documented as reviewed in the EPIC chart.  ROS:  Performed with pertinent positives and negatives included in the history, assessment and plan.   Additional significant findings : None   Exam: Kennon PortelaKim Gardner assistant Vitals:   08/11/18 1213  BP: 118/76  Weight: 154 lb (69.9 kg)  Height: 5\' 7"  (1.702 m)   Body mass index is 24.12 kg/m.  General appearance:  Normal affect, orientation and appearance. Skin: Grossly normal HEENT: Without gross lesions.  No cervical or supraclavicular adenopathy. Thyroid normal.  Lungs:  Clear without wheezing, rales or rhonchi Cardiac: RR, without RMG Abdominal:  Soft, nontender, without masses, guarding, rebound, organomegaly or hernia Breasts:  Examined lying and sitting without masses, retractions, discharge or axillary adenopathy. Pelvic:  Ext, BUS, Vagina: Normal with atrophic changes  Cervix: Normal with atrophic changes.  Pap smear/HPV  Uterus: Anteverted, normal size, shape and contour, midline and mobile nontender   Adnexa: Without masses or tenderness    Anus and perineum: Normal   Rectovaginal: Normal sphincter tone without palpated masses or tenderness.    Assessment/Plan:  62 y.o. [name] female for annual gynecologic exam.   1. Postmenopausal/atrophic genital changes.  No significant menopausal symptoms or any vaginal bleeding. 2. Mammography 07/2018.  Continue with annual mammography next year.  Breast exam normal today. 3. Colonoscopy 2013.  Repeat at their recommended interval. 4. DEXA 2014 T score -1.2.  Repeat DEXA now at 5-year interval.  Patient will schedule in follow-up for this. 5. Pap smear/HPV 2015.  Pap smear/HPV today.  History of LEEP for LGSIL 1992 with  normal Pap smears since. 6. Health maintenance.  Future orders placed for fasting CBC, CMP, lipid profile, TSH and vitamin D.  Patient will return fasting when she schedules her DEXA and have both done the same day.  Follow-up 1 year, sooner as needed.   Dara Lordsimothy P Jobani Sabado MD, 1:09 PM 08/11/2018

## 2018-08-11 NOTE — Addendum Note (Signed)
Addended by: Dayna BarkerGARDNER, Chisum Habenicht K on: 08/11/2018 01:13 PM   Modules accepted: Orders

## 2018-08-12 LAB — PAP IG AND HPV HIGH-RISK: HPV DNA High Risk: NOT DETECTED

## 2018-09-02 ENCOUNTER — Encounter: Payer: 59 | Admitting: Gynecology

## 2018-09-09 ENCOUNTER — Other Ambulatory Visit: Payer: 59

## 2018-09-09 ENCOUNTER — Ambulatory Visit (INDEPENDENT_AMBULATORY_CARE_PROVIDER_SITE_OTHER): Payer: 59

## 2018-09-09 DIAGNOSIS — M8589 Other specified disorders of bone density and structure, multiple sites: Secondary | ICD-10-CM

## 2018-09-09 DIAGNOSIS — Z13820 Encounter for screening for osteoporosis: Secondary | ICD-10-CM

## 2018-09-09 DIAGNOSIS — M8580 Other specified disorders of bone density and structure, unspecified site: Secondary | ICD-10-CM

## 2018-09-10 ENCOUNTER — Telehealth: Payer: Self-pay | Admitting: Gynecology

## 2018-09-10 ENCOUNTER — Encounter: Payer: Self-pay | Admitting: Gynecology

## 2018-09-10 ENCOUNTER — Other Ambulatory Visit: Payer: Self-pay | Admitting: *Deleted

## 2018-09-10 DIAGNOSIS — Z1321 Encounter for screening for nutritional disorder: Secondary | ICD-10-CM

## 2018-09-10 NOTE — Telephone Encounter (Signed)
Patient informed, order placed, patient will call to schedule lab appointment.

## 2018-09-10 NOTE — Telephone Encounter (Signed)
Tell patient that her bone density shows some bone loss at all measured sites.  It is not to the degree that I would recommend medication.  I would recommend weightbearing exercise on a regular basis such as walking.  Adequate calcium intake at 1200 to 1500 mg total dietary calcium daily as well as adequate vitamin D intake are important.  I would recommend checking a vitamin D level to make sure she is in the therapeutic range.  I would recommend repeating the bone density in 2 years.

## 2018-09-20 ENCOUNTER — Other Ambulatory Visit: Payer: Self-pay | Admitting: Gynecology

## 2018-09-20 DIAGNOSIS — Z13820 Encounter for screening for osteoporosis: Secondary | ICD-10-CM

## 2018-09-20 DIAGNOSIS — M8589 Other specified disorders of bone density and structure, multiple sites: Secondary | ICD-10-CM

## 2019-06-21 ENCOUNTER — Encounter: Payer: Self-pay | Admitting: Gynecology

## 2019-07-26 DIAGNOSIS — Z87891 Personal history of nicotine dependence: Secondary | ICD-10-CM | POA: Diagnosis not present

## 2019-07-26 DIAGNOSIS — R05 Cough: Secondary | ICD-10-CM | POA: Diagnosis not present

## 2019-07-26 DIAGNOSIS — Z20828 Contact with and (suspected) exposure to other viral communicable diseases: Secondary | ICD-10-CM | POA: Diagnosis not present

## 2019-07-26 DIAGNOSIS — J4520 Mild intermittent asthma, uncomplicated: Secondary | ICD-10-CM | POA: Diagnosis not present

## 2019-08-10 ENCOUNTER — Encounter: Payer: Self-pay | Admitting: Gynecology

## 2019-08-10 DIAGNOSIS — Z1231 Encounter for screening mammogram for malignant neoplasm of breast: Secondary | ICD-10-CM | POA: Diagnosis not present

## 2019-08-12 ENCOUNTER — Other Ambulatory Visit: Payer: Self-pay

## 2019-08-15 ENCOUNTER — Encounter: Payer: Self-pay | Admitting: Gynecology

## 2019-08-15 ENCOUNTER — Other Ambulatory Visit: Payer: Self-pay

## 2019-08-15 ENCOUNTER — Ambulatory Visit (INDEPENDENT_AMBULATORY_CARE_PROVIDER_SITE_OTHER): Payer: BC Managed Care – PPO | Admitting: Gynecology

## 2019-08-15 VITALS — BP 122/76 | Ht 67.5 in | Wt 155.0 lb

## 2019-08-15 DIAGNOSIS — M8580 Other specified disorders of bone density and structure, unspecified site: Secondary | ICD-10-CM | POA: Diagnosis not present

## 2019-08-15 DIAGNOSIS — Z13220 Encounter for screening for lipoid disorders: Secondary | ICD-10-CM | POA: Diagnosis not present

## 2019-08-15 DIAGNOSIS — N952 Postmenopausal atrophic vaginitis: Secondary | ICD-10-CM | POA: Diagnosis not present

## 2019-08-15 DIAGNOSIS — Z01419 Encounter for gynecological examination (general) (routine) without abnormal findings: Secondary | ICD-10-CM | POA: Diagnosis not present

## 2019-08-15 DIAGNOSIS — E559 Vitamin D deficiency, unspecified: Secondary | ICD-10-CM | POA: Diagnosis not present

## 2019-08-15 NOTE — Progress Notes (Signed)
    Ashley Schultz 1956-01-02 [id number]        63 y.o.  [id number] for annual gynecologic exam.  Without gynecologic complaints.  Past medical history,surgical history, problem list, medications, allergies, family history and social history were all reviewed and documented as reviewed in the EPIC chart.  ROS:  Performed with pertinent positives and negatives included in the history, assessment and plan.   Additional significant findings : None   Exam: Caryn Bee assistant Vitals:   08/15/19 1203  BP: 122/76  Weight: 155 lb (70.3 kg)  Height: 5' 7.5" (1.715 m)   Body mass index is 23.92 kg/m.  General appearance:  Normal affect, orientation and appearance. Skin: Grossly normal HEENT: Without gross lesions.  No cervical or supraclavicular adenopathy. Thyroid normal.  Lungs:  Clear without wheezing, rales or rhonchi Cardiac: RR, without RMG Abdominal:  Soft, nontender, without masses, guarding, rebound, organomegaly or hernia Breasts:  Examined lying and sitting without masses, retractions, discharge or axillary adenopathy. Pelvic:  Ext, BUS, Vagina: Normal with atrophic changes  Cervix: Normal with atrophic changes  Uterus: Anteverted, normal size, shape and contour, midline and mobile nontender   Adnexa: Without masses or tenderness    Anus and perineum: Normal   Rectovaginal: Normal sphincter tone without palpated masses or tenderness.    Assessment/Plan:  63 y.o. [id number] female for annual gynecologic exam.   1. Postmenopausal.  Without significant menopausal symptoms or any vaginal bleeding. 2. Mammography this month.  Continue with annual mammography next year.  Breast exam normal today. 3. Colonoscopy 2013.  Repeat at their recommended interval. 4. Pap smear/HPV 07/2018.  No Pap smear done today.  History of LEEP for LGSIL 1992 with normal Pap smears since.  Plan repeat Pap smear/HPV at 5-year interval per current screening guidelines. 5. Osteopenia.  DEXA 08/2018  T score -2.0 FRAX 7% / 0.6%.  Plan repeat DEXA next year at 2-year interval.  Check baseline TSH and vitamin D. 6. Health maintenance.  Baseline CBC, CMP, lipid profile, TSH and vitamin D ordered.  Follow-up 1 year, sooner as needed.   Anastasio Auerbach MD, 12:39 PM 08/15/2019

## 2019-08-15 NOTE — Patient Instructions (Signed)
Follow-up in 1 year for annual exam 

## 2019-08-16 ENCOUNTER — Other Ambulatory Visit: Payer: Self-pay

## 2019-08-16 DIAGNOSIS — E559 Vitamin D deficiency, unspecified: Secondary | ICD-10-CM

## 2019-08-16 LAB — COMPREHENSIVE METABOLIC PANEL
AG Ratio: 1.8 (calc) (ref 1.0–2.5)
ALT: 15 U/L (ref 6–29)
AST: 19 U/L (ref 10–35)
Albumin: 4.3 g/dL (ref 3.6–5.1)
Alkaline phosphatase (APISO): 77 U/L (ref 37–153)
BUN: 16 mg/dL (ref 7–25)
CO2: 29 mmol/L (ref 20–32)
Calcium: 9.5 mg/dL (ref 8.6–10.4)
Chloride: 102 mmol/L (ref 98–110)
Creat: 0.8 mg/dL (ref 0.50–0.99)
Globulin: 2.4 g/dL (calc) (ref 1.9–3.7)
Glucose, Bld: 87 mg/dL (ref 65–99)
Potassium: 4.5 mmol/L (ref 3.5–5.3)
Sodium: 139 mmol/L (ref 135–146)
Total Bilirubin: 0.4 mg/dL (ref 0.2–1.2)
Total Protein: 6.7 g/dL (ref 6.1–8.1)

## 2019-08-16 LAB — CBC WITH DIFFERENTIAL/PLATELET
Absolute Monocytes: 589 cells/uL (ref 200–950)
Basophils Absolute: 37 cells/uL (ref 0–200)
Basophils Relative: 0.6 %
Eosinophils Absolute: 198 cells/uL (ref 15–500)
Eosinophils Relative: 3.2 %
HCT: 38.8 % (ref 35.0–45.0)
Hemoglobin: 12.7 g/dL (ref 11.7–15.5)
Lymphs Abs: 2604 cells/uL (ref 850–3900)
MCH: 27.8 pg (ref 27.0–33.0)
MCHC: 32.7 g/dL (ref 32.0–36.0)
MCV: 84.9 fL (ref 80.0–100.0)
MPV: 9.4 fL (ref 7.5–12.5)
Monocytes Relative: 9.5 %
Neutro Abs: 2771 cells/uL (ref 1500–7800)
Neutrophils Relative %: 44.7 %
Platelets: 337 10*3/uL (ref 140–400)
RBC: 4.57 10*6/uL (ref 3.80–5.10)
RDW: 12.8 % (ref 11.0–15.0)
Total Lymphocyte: 42 %
WBC: 6.2 10*3/uL (ref 3.8–10.8)

## 2019-08-16 LAB — LIPID PANEL
Cholesterol: 192 mg/dL (ref <200)
HDL: 78 mg/dL (ref >=50)
LDL Cholesterol (Calc): 100 mg/dL (calc) — ABNORMAL HIGH
Non-HDL Cholesterol (Calc): 114 mg/dL (calc) (ref <130)
Total CHOL/HDL Ratio: 2.5 (calc) (ref <5.0)
Triglycerides: 53 mg/dL (ref <150)

## 2019-08-16 LAB — VITAMIN D 25 HYDROXY (VIT D DEFICIENCY, FRACTURES): Vit D, 25-Hydroxy: 22 ng/mL — ABNORMAL LOW (ref 30–100)

## 2019-08-16 LAB — TSH: TSH: 3.3 mIU/L (ref 0.40–4.50)

## 2019-08-16 MED ORDER — VITAMIN D (ERGOCALCIFEROL) 1.25 MG (50000 UNIT) PO CAPS: 50000.0000 [IU] | ORAL_CAPSULE | ORAL | 0 refills | Status: DC

## 2019-11-09 ENCOUNTER — Other Ambulatory Visit: Payer: Self-pay

## 2019-11-09 DIAGNOSIS — E559 Vitamin D deficiency, unspecified: Secondary | ICD-10-CM

## 2019-11-10 LAB — VITAMIN D 25 HYDROXY (VIT D DEFICIENCY, FRACTURES): Vit D, 25-Hydroxy: 51 ng/mL (ref 30–100)

## 2019-11-22 NOTE — Progress Notes (Signed)
Normal vitamin D level please let patient know.

## 2019-11-23 ENCOUNTER — Telehealth: Payer: Self-pay

## 2019-11-23 NOTE — Telephone Encounter (Signed)
Per DPR access note on file I left message in voice mail with recommended doseage.

## 2019-11-23 NOTE — Telephone Encounter (Addendum)
This was a follow up test after taking prescription strength Vitamin D 50,000 x 12 weeks.  Patient would like you to advise how much daily supplement she should take from this point?  Patient was taking 1000 units daily prior to Rx.

## 2019-11-23 NOTE — Telephone Encounter (Signed)
-----   Message from Theresia Majors, MD sent at 11/22/2019  5:32 PM EST ----- Normal vitamin D level please let patient know.

## 2019-11-23 NOTE — Telephone Encounter (Signed)
2000 IU vitamin D daily

## 2020-06-06 ENCOUNTER — Encounter: Payer: Self-pay | Admitting: Nurse Practitioner

## 2020-06-06 ENCOUNTER — Ambulatory Visit: Payer: BC Managed Care – PPO | Admitting: Nurse Practitioner

## 2020-06-06 ENCOUNTER — Other Ambulatory Visit: Payer: Self-pay

## 2020-06-06 VITALS — BP 122/78

## 2020-06-06 DIAGNOSIS — M545 Low back pain, unspecified: Secondary | ICD-10-CM

## 2020-06-06 DIAGNOSIS — R8290 Unspecified abnormal findings in urine: Secondary | ICD-10-CM

## 2020-06-06 NOTE — Patient Instructions (Signed)

## 2020-06-06 NOTE — Progress Notes (Signed)
   Acute Office Visit  Subjective:    Patient ID: Ashley Schultz, female    DOB: [birth year], 64 y.o.   MRN: [id number]   HPI 64 y.o. presents today for left lower back pain that started a few days ago after moving boxes but has progressively worsened. She describes the pain as constant, dull/achy, radiating mildly on the right side, worse when sitting for long periods, repositioning does not help, and she has not tried anything for relief. She has had sciatica in the past and says this doesn't feel exactly the same. Pain does not radiate down buttock/leg, denies numbness or tingling. She also reports that she noticed she did not urinate as frequently today as normal and says it was cloudy. Work is very busy so she's not sure if it's related. No dysuria, frequency, or urgency. Denies bladder or bowel incontinence.    Review of Systems  Constitutional: Negative.   Gastrointestinal: Negative.   Genitourinary: Negative.   Musculoskeletal: Positive for back pain.  Neurological: Negative for weakness and numbness.       Objective:    Physical Exam Constitutional:      Appearance: Normal appearance.  Abdominal:     Tenderness: There is no right CVA tenderness or left CVA tenderness.  Musculoskeletal:     Lumbar back: Normal.       Legs:     BP 122/78 (BP Location: Right Arm, Patient Position: Sitting, Cuff Size: Normal)  Wt Readings from Last 3 Encounters:  08/15/19 155 lb (70.3 kg)  08/11/18 154 lb (69.9 kg)  01/08/17 150 lb (68 kg)        Assessment & Plan:   Problem List Items Addressed This Visit    None    Visit Diagnoses    Acute left-sided low back pain, unspecified whether sciatica present    -  Primary   Relevant Orders   Urinalysis,Complete w/RFL Culture      Plan: Most likely sciatica versus muscle strain from moving boxes a few days prior. Recommend rest and ibuprofen 800 mg every 8 hours. If she is still having severe pain we can try a muscle relaxer  or steroid taper. If symptoms do not improve after 4 weeks we will refer for xray. UA unremarkable. She does have history of osteopenia so we must consider the possibility of a fracture. She is agreeable to plan.     Olivia Mackie [facility], 2:16 PM 06/06/2020

## 2020-06-07 ENCOUNTER — Telehealth: Payer: Self-pay | Admitting: *Deleted

## 2020-06-07 MED ORDER — IBUPROFEN 800 MG PO TABS: 800.0000 mg | ORAL_TABLET | Freq: Three times a day (TID) | ORAL | 0 refills | Status: AC | PRN

## 2020-06-07 NOTE — Telephone Encounter (Signed)
Yes that's fine. Thank you

## 2020-06-07 NOTE — Telephone Encounter (Signed)
Patient informed, Rx sent.  

## 2020-06-07 NOTE — Telephone Encounter (Signed)
Patient called requesting Rx for ibuprofen 800 mg tablet,was seen yesterday for acute left side back pain. OTC is now helping. Okay to send?

## 2020-06-08 LAB — URINALYSIS, COMPLETE W/RFL CULTURE
Bilirubin Urine: NEGATIVE
Glucose, UA: NEGATIVE
Hyaline Cast: NONE SEEN /LPF
Ketones, ur: NEGATIVE
Nitrites, Initial: NEGATIVE
Protein, ur: NEGATIVE
RBC / HPF: NONE SEEN /HPF (ref 0–2)
Specific Gravity, Urine: 1.018 (ref 1.001–1.03)
pH: 8.5 — ABNORMAL HIGH (ref 5.0–8.0)

## 2020-06-08 LAB — URINE CULTURE
MICRO NUMBER:: 10953694
Result:: NO GROWTH
SPECIMEN QUALITY:: ADEQUATE

## 2020-06-08 LAB — CULTURE INDICATED

## 2020-07-27 ENCOUNTER — Other Ambulatory Visit: Payer: Self-pay | Admitting: Physician Assistant

## 2020-07-27 DIAGNOSIS — R1314 Dysphagia, pharyngoesophageal phase: Secondary | ICD-10-CM | POA: Diagnosis not present

## 2020-07-27 DIAGNOSIS — Z1211 Encounter for screening for malignant neoplasm of colon: Secondary | ICD-10-CM | POA: Diagnosis not present

## 2020-07-27 DIAGNOSIS — R1319 Other dysphagia: Secondary | ICD-10-CM

## 2020-07-30 ENCOUNTER — Ambulatory Visit
Admission: RE | Admit: 2020-07-30 | Discharge: 2020-07-30 | Disposition: A | Discharge status: Home / Self Care | Payer: 59 | Source: Ambulatory Visit | Attending: Physician Assistant | Admitting: Physician Assistant

## 2020-07-30 DIAGNOSIS — R1319 Other dysphagia: Secondary | ICD-10-CM

## 2020-07-30 DIAGNOSIS — R1310 Dysphagia, unspecified: Secondary | ICD-10-CM | POA: Diagnosis not present

## 2020-07-31 DIAGNOSIS — Z1211 Encounter for screening for malignant neoplasm of colon: Secondary | ICD-10-CM | POA: Diagnosis not present

## 2020-08-02 DIAGNOSIS — Z1159 Encounter for screening for other viral diseases: Secondary | ICD-10-CM | POA: Diagnosis not present

## 2020-08-07 DIAGNOSIS — K317 Polyp of stomach and duodenum: Secondary | ICD-10-CM | POA: Diagnosis not present

## 2020-08-07 DIAGNOSIS — K2289 Other specified disease of esophagus: Secondary | ICD-10-CM | POA: Diagnosis not present

## 2020-08-07 DIAGNOSIS — K3189 Other diseases of stomach and duodenum: Secondary | ICD-10-CM | POA: Diagnosis not present

## 2020-08-07 DIAGNOSIS — R1310 Dysphagia, unspecified: Secondary | ICD-10-CM | POA: Diagnosis not present

## 2020-08-07 DIAGNOSIS — K449 Diaphragmatic hernia without obstruction or gangrene: Secondary | ICD-10-CM | POA: Diagnosis not present

## 2020-08-07 DIAGNOSIS — K2970 Gastritis, unspecified, without bleeding: Secondary | ICD-10-CM | POA: Diagnosis not present

## 2020-08-07 DIAGNOSIS — K2930 Chronic superficial gastritis without bleeding: Secondary | ICD-10-CM | POA: Diagnosis not present

## 2020-08-07 DIAGNOSIS — K222 Esophageal obstruction: Secondary | ICD-10-CM | POA: Diagnosis not present

## 2020-08-15 ENCOUNTER — Ambulatory Visit (INDEPENDENT_AMBULATORY_CARE_PROVIDER_SITE_OTHER): Payer: BC Managed Care – PPO | Admitting: Obstetrics and Gynecology

## 2020-08-15 ENCOUNTER — Encounter: Payer: Self-pay | Admitting: Obstetrics and Gynecology

## 2020-08-15 ENCOUNTER — Other Ambulatory Visit: Payer: Self-pay

## 2020-08-15 VITALS — Ht 67.0 in | Wt 157.0 lb

## 2020-08-15 DIAGNOSIS — Z01419 Encounter for gynecological examination (general) (routine) without abnormal findings: Secondary | ICD-10-CM | POA: Diagnosis not present

## 2020-08-15 DIAGNOSIS — M8580 Other specified disorders of bone density and structure, unspecified site: Secondary | ICD-10-CM

## 2020-08-15 DIAGNOSIS — Z1231 Encounter for screening mammogram for malignant neoplasm of breast: Secondary | ICD-10-CM | POA: Diagnosis not present

## 2020-08-15 DIAGNOSIS — E559 Vitamin D deficiency, unspecified: Secondary | ICD-10-CM | POA: Diagnosis not present

## 2020-08-15 DIAGNOSIS — Z13220 Encounter for screening for lipoid disorders: Secondary | ICD-10-CM | POA: Diagnosis not present

## 2020-08-15 NOTE — Progress Notes (Signed)
   Ashley Schultz 11/11/1955 [id number]  SUBJECTIVE:  64 y.o. [id number] female for annual routine gynecologic exam. She has no gynecologic concerns.  Current Outpatient Medications  Medication Sig Dispense Refill  . Ascorbic Acid (VITAMIN C PO) Take by mouth.    Marland Kitchen CALCIUM PO Take by mouth.    . Cetirizine HCl (ZYRTEC PO) Take by mouth.    . Cholecalciferol (VITAMIN D PO) Take by mouth.    Marland Kitchen ibuprofen (ADVIL) 800 MG tablet Take 1 tablet (800 mg total) by mouth every 8 (eight) hours as needed. 30 tablet 0  . IRON PO Take by mouth.    . Multiple Vitamin (MULTIVITAMIN) capsule Take 1 capsule by mouth daily.    . pantoprazole (PROTONIX) 40 MG tablet Take 40 mg by mouth daily.    Marland Kitchen VITAMIN E PO Take by mouth.     No current facility-administered medications for this visit.   Allergies: Codeine, Darvon [propoxyphene hcl], and Penicillins  No LMP recorded. Patient is postmenopausal.  Past medical history,surgical history, problem list, medications, allergies, family history and social history were all reviewed and documented as reviewed in the EPIC chart.  ROS: Pertinent positives and negatives as reviewed in HPI  OBJECTIVE:  Ht 5\' 7"  (1.702 m)   Wt 157 lb (71.2 kg)   BMI 24.59 kg/m  The patient appears well, alert, oriented, in no distress. ENT normal.  Neck supple. No cervical or supraclavicular adenopathy or thyromegaly.  Lungs are clear, good air entry, no wheezes, rhonchi or rales. S1 and S2 normal, no murmurs, regular rate and rhythm.  Abdomen soft without tenderness, guarding, mass or organomegaly.  Neurological is normal, no focal findings.  BREAST EXAM: breasts appear normal, no suspicious masses, no skin or nipple changes or axillary nodes  PELVIC EXAM: VULVA: normal appearing vulva with atrophic changes, no masses, tenderness or lesions, VAGINA: normal appearing vagina with atrophic changes, normal color and discharge, no lesions, CERVIX: normal appearing cervix without  discharge or lesions, UTERUS: uterus is normal size, shape, consistency and nontender, ADNEXA: normal adnexa in size, nontender and no masses  Chaperone: present during the examination  ASSESSMENT:  64 y.o. 70 here for annual gynecologic exam  PLAN:   1. Postmenopausal.  No significant hot flashes or night sweats.  No vaginal bleeding. 2. Pap smear/HPV 07/2018.  History LEEP for LGSIL 1992, normal Pap smears since then.  Next Pap smear due 2024 following the current guidelines recommending the 5 year interval. 3. Mammogram 07/2020.  Normal breast exam today.  She will continue with annual mammograms.   4. Colonoscopy 2013.  Recommended that she follow up at the recommended interval.   5. Osteopenia.  DEXA 08/2018.  T score -2.0, FRAX 7% / 0.6%.  Next DEXA recommended soon so she will plan to get this scheduled. 6. Health maintenance.  No labs today as she indicates that she did this through her primary care provider.  Return annually or sooner, prn.  09/2018 MD 08/15/20

## 2020-08-29 DIAGNOSIS — I341 Nonrheumatic mitral (valve) prolapse: Secondary | ICD-10-CM | POA: Diagnosis not present

## 2020-08-29 DIAGNOSIS — M8580 Other specified disorders of bone density and structure, unspecified site: Secondary | ICD-10-CM | POA: Diagnosis not present

## 2020-08-29 DIAGNOSIS — Z0000 Encounter for general adult medical examination without abnormal findings: Secondary | ICD-10-CM | POA: Diagnosis not present

## 2020-08-29 DIAGNOSIS — R1310 Dysphagia, unspecified: Secondary | ICD-10-CM | POA: Diagnosis not present

## 2020-09-11 DIAGNOSIS — B349 Viral infection, unspecified: Secondary | ICD-10-CM | POA: Diagnosis not present

## 2020-09-12 DIAGNOSIS — Z20822 Contact with and (suspected) exposure to covid-19: Secondary | ICD-10-CM | POA: Diagnosis not present

## 2020-09-19 DIAGNOSIS — I341 Nonrheumatic mitral (valve) prolapse: Secondary | ICD-10-CM | POA: Diagnosis not present

## 2020-09-25 DIAGNOSIS — Z1329 Encounter for screening for other suspected endocrine disorder: Secondary | ICD-10-CM | POA: Diagnosis not present

## 2020-09-25 DIAGNOSIS — M8580 Other specified disorders of bone density and structure, unspecified site: Secondary | ICD-10-CM | POA: Diagnosis not present

## 2020-09-25 DIAGNOSIS — Z13220 Encounter for screening for lipoid disorders: Secondary | ICD-10-CM | POA: Diagnosis not present

## 2020-10-01 DIAGNOSIS — R1319 Other dysphagia: Secondary | ICD-10-CM | POA: Diagnosis not present

## 2020-10-01 DIAGNOSIS — M8580 Other specified disorders of bone density and structure, unspecified site: Secondary | ICD-10-CM | POA: Diagnosis not present

## 2020-10-01 DIAGNOSIS — R55 Syncope and collapse: Secondary | ICD-10-CM | POA: Diagnosis not present

## 2020-10-24 DIAGNOSIS — R1314 Dysphagia, pharyngoesophageal phase: Secondary | ICD-10-CM | POA: Diagnosis not present

## 2020-10-24 DIAGNOSIS — K219 Gastro-esophageal reflux disease without esophagitis: Secondary | ICD-10-CM | POA: Diagnosis not present

## 2020-10-24 DIAGNOSIS — K2970 Gastritis, unspecified, without bleeding: Secondary | ICD-10-CM | POA: Diagnosis not present

## 2020-10-24 DIAGNOSIS — K222 Esophageal obstruction: Secondary | ICD-10-CM | POA: Diagnosis not present

## 2020-11-20 ENCOUNTER — Other Ambulatory Visit: Payer: Self-pay

## 2020-11-20 ENCOUNTER — Other Ambulatory Visit: Payer: Self-pay | Admitting: Obstetrics and Gynecology

## 2020-11-20 ENCOUNTER — Ambulatory Visit (INDEPENDENT_AMBULATORY_CARE_PROVIDER_SITE_OTHER): Payer: BC Managed Care – PPO

## 2020-11-20 DIAGNOSIS — M8589 Other specified disorders of bone density and structure, multiple sites: Secondary | ICD-10-CM | POA: Diagnosis not present

## 2020-11-20 DIAGNOSIS — Z01419 Encounter for gynecological examination (general) (routine) without abnormal findings: Secondary | ICD-10-CM

## 2020-11-20 DIAGNOSIS — M8580 Other specified disorders of bone density and structure, unspecified site: Secondary | ICD-10-CM

## 2020-11-20 DIAGNOSIS — Z780 Asymptomatic menopausal state: Secondary | ICD-10-CM | POA: Diagnosis not present

## 2021-01-13 ENCOUNTER — Encounter (HOSPITAL_COMMUNITY): Payer: Self-pay | Admitting: Emergency Medicine

## 2021-01-13 ENCOUNTER — Emergency Department (HOSPITAL_COMMUNITY)
Admission: EM | Admit: 2021-01-13 | Discharge: 2021-01-13 | Disposition: A | Discharge status: Home / Self Care | Payer: BC Managed Care – PPO | Attending: Emergency Medicine | Admitting: Emergency Medicine

## 2021-01-13 ENCOUNTER — Emergency Department (HOSPITAL_COMMUNITY): Payer: BC Managed Care – PPO

## 2021-01-13 ENCOUNTER — Other Ambulatory Visit: Payer: Self-pay

## 2021-01-13 DIAGNOSIS — R42 Dizziness and giddiness: Secondary | ICD-10-CM | POA: Diagnosis not present

## 2021-01-13 DIAGNOSIS — R200 Anesthesia of skin: Secondary | ICD-10-CM | POA: Insufficient documentation

## 2021-01-13 DIAGNOSIS — R29810 Facial weakness: Secondary | ICD-10-CM | POA: Diagnosis not present

## 2021-01-13 DIAGNOSIS — E860 Dehydration: Secondary | ICD-10-CM | POA: Diagnosis not present

## 2021-01-13 DIAGNOSIS — R55 Syncope and collapse: Secondary | ICD-10-CM | POA: Diagnosis not present

## 2021-01-13 DIAGNOSIS — Z87891 Personal history of nicotine dependence: Secondary | ICD-10-CM | POA: Diagnosis not present

## 2021-01-13 DIAGNOSIS — T675XXA Heat exhaustion, unspecified, initial encounter: Secondary | ICD-10-CM

## 2021-01-13 DIAGNOSIS — R202 Paresthesia of skin: Secondary | ICD-10-CM | POA: Diagnosis not present

## 2021-01-13 LAB — BASIC METABOLIC PANEL
Anion gap: 10 (ref 5–15)
BUN: 12 mg/dL (ref 8–23)
CO2: 22 mmol/L (ref 22–32)
Calcium: 8.9 mg/dL (ref 8.9–10.3)
Chloride: 104 mmol/L (ref 98–111)
Creatinine, Ser: 0.79 mg/dL (ref 0.44–1.00)
GFR, Estimated: >60 mL/min (ref >60)
Glucose, Bld: 112 mg/dL — ABNORMAL HIGH (ref 70–99)
Potassium: 3.5 mmol/L (ref 3.5–5.1)
Sodium: 136 mmol/L (ref 135–145)

## 2021-01-13 LAB — CBC
HCT: 38.7 % (ref 36.0–46.0)
Hemoglobin: 12.2 g/dL (ref 12.0–15.0)
MCH: 26.9 pg (ref 26.0–34.0)
MCHC: 31.5 g/dL (ref 30.0–36.0)
MCV: 85.2 fL (ref 80.0–100.0)
Platelets: 374 10*3/uL (ref 150–400)
RBC: 4.54 MIL/uL (ref 3.87–5.11)
RDW: 13.2 % (ref 11.5–15.5)
WBC: 11.9 10*3/uL — ABNORMAL HIGH (ref 4.0–10.5)
nRBC: 0 % (ref 0.0–0.2)

## 2021-01-13 LAB — CBG MONITORING, ED: Glucose-Capillary: 122 mg/dL — ABNORMAL HIGH (ref 70–99)

## 2021-01-13 MED ORDER — SODIUM CHLORIDE 0.9% FLUSH
3.0000 mL | Freq: Once | INTRAVENOUS | Status: AC
  Administered 2021-01-13: 3 mL via INTRAVENOUS

## 2021-01-13 MED ORDER — SODIUM CHLORIDE 0.9 % IV BOLUS
1000.0000 mL | Freq: Once | INTRAVENOUS | Status: AC
  Administered 2021-01-13: 1000 mL via INTRAVENOUS

## 2021-01-13 NOTE — ED Provider Notes (Signed)
[facility] EMERGENCY DEPARTMENT Provider Note   CSN: [id number] Arrival date & time: 01/13/21  1642     History Chief Complaint  Patient presents with  . Dizziness    Ashley Schultz is a 65 y.o. female here presenting with dizziness.  Patient states that she is outside in the heat a lot and did not drink or eat much today.  She was doing some manual labor as well.  She states that she felt very lightheaded and dizzy.  She also felt numbness and tingling down both arms which prompted her to come to the ED. She then had blood drawn in the ED and felt very lightheaded and slid down her chair.  She was noted to be altered for about a minute or so.  Patient states that she has a history of vasovagal syncope.  She denies any chest pain or shortness of breath.  No witnessed seizure activity.  The history is provided by the patient.       Past Medical History:  Diagnosis Date  . Cervical dysplasia 1992   CIN I  . Hiatal hernia   . Osteopenia 08/2018   T score -2.0 FRAX 7% / 0.6%    Patient Active Problem List   Diagnosis Date Noted  . Osteopenia     Past Surgical History:  Procedure Laterality Date  . CERVICAL BIOPSY  W/ LOOP ELECTRODE EXCISION  1992   CIN I  . GYNECOLOGIC CRYOSURGERY       OB History    Gravida  4   Para  2   Term  2   Preterm      AB  2   Living  2     SAB      IAB      Ectopic      Multiple      Live Births              Family History  Problem Relation Age of Onset  . Pancreatic cancer Father   . Lung cancer Paternal Grandfather   . Heart attack Mother   . Cancer Mother        Angio sarcoma    Social History   Tobacco Use  . Smoking status: Former Games developer  . Smokeless tobacco: Never Used  Vaping Use  . Vaping Use: Never used  Substance Use Topics  . Alcohol use: Yes    Alcohol/week: 3.0 standard drinks    Types: 3 Standard drinks or equivalent per week  . Drug use: No    Home  Medications Prior to Admission medications   Medication Sig Start Date End Date Taking? Authorizing Provider  Ascorbic Acid (VITAMIN C PO) Take by mouth.    [provider]  CALCIUM PO Take by mouth.    [provider]  Cetirizine HCl (ZYRTEC PO) Take by mouth.    [provider]  Cholecalciferol (VITAMIN D PO) Take by mouth.    [provider]  ibuprofen (ADVIL) 800 MG tablet Take 1 tablet (800 mg total) by mouth every 8 (eight) hours as needed. 06/07/20   Wyline Beady A, NP  IRON PO Take by mouth.    [provider]  Multiple Vitamin (MULTIVITAMIN) capsule Take 1 capsule by mouth daily.    [provider]  pantoprazole (PROTONIX) 40 MG tablet Take 40 mg by mouth daily.    [provider]  VITAMIN E PO Take by mouth.    [provider]    Allergies    Codeine, Darvon [propoxyphene hcl], and Penicillins  Review of Systems   Review of Systems  Neurological: Positive for dizziness.  All other systems reviewed and are negative.   Physical Exam Updated Vital Signs BP (!) 142/86 (BP Location: Right Arm)   Pulse 69   Temp 98.7 F (37.1 C) (Oral)   Resp 13   SpO2 100%   Physical Exam Vitals and nursing note reviewed.  Constitutional:      Comments: Dehydrated  HENT:     Head: Normocephalic.     Nose: Nose normal.     Mouth/Throat:     Mouth: Mucous membranes are dry.  Eyes:     Extraocular Movements: Extraocular movements intact.     Pupils: Pupils are equal, round, and reactive to light.  Cardiovascular:     Rate and Rhythm: Normal rate and regular rhythm.     Pulses: Normal pulses.     Heart sounds: Normal heart sounds.  Pulmonary:     Effort: Pulmonary effort is normal.     Breath sounds: Normal breath sounds.  Abdominal:     General: Abdomen is flat.     Palpations: Abdomen is soft.  Musculoskeletal:        General: Normal range of motion.     Cervical back: Normal range of motion and neck  supple.  Skin:    General: Skin is warm.     Capillary Refill: Capillary refill takes less than 2 seconds.  Neurological:     General: No focal deficit present.     Mental Status: She is oriented to person, place, and time.  Psychiatric:        Mood and Affect: Mood normal.        Behavior: Behavior normal.     ED Results / Procedures / Treatments   Labs (all labs ordered are listed, but only abnormal results are displayed) Labs Reviewed  BASIC METABOLIC PANEL - Abnormal; Notable for the following components:      Result Value   Glucose, Bld 112 (*)    All other components within normal limits  CBC - Abnormal; Notable for the following components:   WBC 11.9 (*)    All other components within normal limits  CBG MONITORING, ED - Abnormal; Notable for the following components:   Glucose-Capillary 122 (*)    All other components within normal limits  URINALYSIS, ROUTINE W REFLEX MICROSCOPIC    EKG EKG Interpretation  Date/Time:  Sunday January 13 2021 16:54:11 EDT Ventricular Rate:  75 PR Interval:  180 QRS Duration: 74 QT Interval:  428 QTC Calculation: 477 R Axis:   88 Text Interpretation: Normal sinus rhythm Normal ECG No significant change since last tracing Confirmed by Richardean Canal [id number] on 01/13/2021 6:34:30 PM   Radiology CT Head Wo Contrast  Result Date: 01/13/2021 CLINICAL DATA:  Nonspecific dizziness. EXAM: CT HEAD WITHOUT CONTRAST TECHNIQUE: Contiguous axial images were obtained from the base of the skull through the vertex without intravenous contrast. COMPARISON:  January 08, 2017 FINDINGS: Brain: No evidence of acute infarction, hemorrhage, hydrocephalus, extra-axial collection or mass lesion/mass effect. Vascular: No hyperdense vessel or unexpected calcification. Skull: Normal. Negative for fracture or focal lesion. Sinuses/Orbits: Mucosal thickening and maxillary sinuses and ethmoid air cells. Other: On the scout view, there is anterolisthesis of C4 versus C5  which is an age-indeterminate finding. There are no comparison study. No other abnormalities. IMPRESSION: 1. Anterolisthesis of C4 versus C5 on  the scout view, incompletely evaluated. This is an age-indeterminate finding. CT imaging could better evaluate if clinically warranted. 2. No acute intracranial abnormalities. Electronically Signed   By: Gerome Sam III M.D   On: 01/13/2021 19:43    Procedures Procedures   Medications Ordered in ED Medications  sodium chloride flush (NS) 0.9 % injection 3 mL (3 mLs Intravenous Given 01/13/21 1757)  sodium chloride 0.9 % bolus 1,000 mL (1,000 mLs Intravenous New Bag/Given 01/13/21 1917)    ED Course  I have reviewed the triage vital signs and the nursing notes.  Pertinent labs & imaging results that were available during my care of the patient were reviewed by me and considered in my medical decision making (see chart for details).    MDM Rules/Calculators/A&P                         Ashley Schultz is a 65 y.o. female here presenting with dizziness.  Patient likely has heat exhaustion.  Patient appears dehydrated.  She may have vasovagal syncope after blood draw.  Plan to get blood work and CT head.  Will hydrate patient and get orthostatic  8:49 PM Patient is not orthostatic and given IV fluids and felt better.  CT head unremarkable.  I think likely heat exhaustion.  Told her to stay hydrated.     Final Clinical Impression(s) / ED Diagnoses Final diagnoses:  None    Rx / DC Orders ED Discharge Orders    None       Charlynne Pander, MD 01/13/21 2050

## 2021-01-13 NOTE — Discharge Instructions (Signed)
You likely have heat exhaustion.  Stay hydrated.  See your doctor for follow up  Return to ER if you have worse dizziness, vomiting, passing out.

## 2021-01-13 NOTE — ED Notes (Addendum)
Pt had syncopal episode in triage.  She stated she was going to pass out prior to syncopal event.  Unresponsive for approx 1 minute while sitting in wheelchair.  RN and NT with pt and kept her in wheelchair.  Pt moved to treatment room.

## 2021-01-13 NOTE — ED Notes (Signed)
Patient transported to CT 

## 2021-01-13 NOTE — ED Triage Notes (Signed)
Pt to triage via [facility] from home.  She was working outside for 4 hours in the heat and started feeling dizzy and tingling to arms and legs.  She had a near syncopal event and was lowered to the ground by husband and given water.  Reports generalized weakness and nausea.  20g LAC, NS 500cc given by EMS.  CBG 133.

## 2021-08-20 ENCOUNTER — Other Ambulatory Visit: Payer: Self-pay

## 2021-08-20 ENCOUNTER — Ambulatory Visit (INDEPENDENT_AMBULATORY_CARE_PROVIDER_SITE_OTHER): Payer: BC Managed Care – PPO | Admitting: Nurse Practitioner

## 2021-08-20 ENCOUNTER — Encounter: Payer: Self-pay | Admitting: Nurse Practitioner

## 2021-08-20 ENCOUNTER — Encounter: Payer: BC Managed Care – PPO | Admitting: Obstetrics and Gynecology

## 2021-08-20 VITALS — BP 118/76 | Ht 67.0 in | Wt 162.0 lb

## 2021-08-20 DIAGNOSIS — Z780 Asymptomatic menopausal state: Secondary | ICD-10-CM | POA: Diagnosis not present

## 2021-08-20 DIAGNOSIS — Z01419 Encounter for gynecological examination (general) (routine) without abnormal findings: Secondary | ICD-10-CM | POA: Diagnosis not present

## 2021-08-20 DIAGNOSIS — M8589 Other specified disorders of bone density and structure, multiple sites: Secondary | ICD-10-CM

## 2021-08-20 NOTE — Progress Notes (Signed)
   Ashley Schultz [birth year] [id number]   History:  65 y.o. G3T5176 presents for annual exam. Postmenopausal - no HRT, no bleeding. 1992 LEEP, subsequent paps normal.   Gynecologic History No LMP recorded. Patient is postmenopausal.   Contraception/Family planning: post menopausal status Sexually active: Yes  Health Maintenance Last Pap: 08/11/2018. Results were: Normal, 5-year repeat Last mammogram: 08/15/2020. Results were: Normal Last colonoscopy: 2013. Results were: Normal, 10-year recall Last Dexa: 11/20/2020. Results were: T-score -1.8, FRAX 9.5% / 1.1%  Past medical history, past surgical history, family history and social history were all reviewed and documented in the EPIC chart. Married. Building control surveyor. 2 daughters, 2 granddaughters.   ROS:  A ROS was performed and pertinent positives and negatives are included.  Exam:  Vitals:   08/20/21 0804  BP: 118/76  Weight: 162 lb (73.5 kg)  Height: 5\' 7"  (1.702 m)   Body mass index is 25.37 kg/m.  General appearance:  Normal Thyroid:  Symmetrical, normal in size, without palpable masses or nodularity. Respiratory  Auscultation:  Clear without wheezing or rhonchi Cardiovascular  Auscultation:  Regular rate, without rubs, murmurs or gallops  Edema/varicosities:  Not grossly evident Abdominal  Soft,nontender, without masses, guarding or rebound.  Liver/spleen:  No organomegaly noted  Hernia:  None appreciated  Skin  Inspection:  Grossly normal Breasts: Examined lying and sitting.   Right: Without masses, retractions, nipple discharge or axillary adenopathy.   Left: Without masses, retractions, nipple discharge or axillary adenopathy. Genitourinary   Inguinal/mons:  Normal without inguinal adenopathy  External genitalia:  Normal appearing vulva with no masses, tenderness, or lesions  BUS/Urethra/Skene's glands:  Normal  Vagina:  Normal appearing with normal color and discharge, no lesions. Atrophic  changes  Cervix:  Normal appearing without discharge or lesions  Uterus:  Normal in size, shape and contour.  Midline and mobile, nontender  Adnexa/parametria:     Rt: Normal in size, without masses or tenderness.   Lt: Normal in size, without masses or tenderness.  Anus and perineum: Normal  Digital rectal exam: Normal sphincter tone without palpated masses or tenderness  Patient informed chaperone available to be present for breast and pelvic exam. Patient has requested no chaperone to be present. Patient has been advised what will be completed during breast and pelvic exam.   Assessment/Plan:  65 y.o. 65 for annual exam.   Well female exam with routine gynecological exam - Education provided on SBEs, importance of preventative screenings, current guidelines, high calcium diet, regular exercise, and multivitamin daily.  Labs with PCP.   Postmenopausal - no HRT, no bleeding.   Osteopenia of multiple sites - T-score - 1.8 without elevated FRAX. Continue Vitamin D + calcium supplement and increase exercise, incorporating resistance training.   Screening for cervical cancer - 1992 LEEP, subsequent paps normal. Will repeat at 5-year interval per guidelines.  Screening for breast cancer - Normal mammogram history.  Continue annual screenings.  Normal breast exam today. Mammogram scheduled tomorrow.   Screening for colon cancer - 2013 colonoscopy. Will repeat at GI's recommended interval.   Return in 1 year for annual.   2014 DNP, 8:25 AM 08/20/2021

## 2021-08-21 DIAGNOSIS — Z1231 Encounter for screening mammogram for malignant neoplasm of breast: Secondary | ICD-10-CM | POA: Diagnosis not present

## 2021-08-26 ENCOUNTER — Encounter: Payer: Self-pay | Admitting: Nurse Practitioner

## 2022-03-09 ENCOUNTER — Emergency Department (HOSPITAL_BASED_OUTPATIENT_CLINIC_OR_DEPARTMENT_OTHER): Payer: BC Managed Care – PPO

## 2022-03-09 ENCOUNTER — Encounter (HOSPITAL_BASED_OUTPATIENT_CLINIC_OR_DEPARTMENT_OTHER): Payer: Self-pay | Admitting: Emergency Medicine

## 2022-03-09 ENCOUNTER — Other Ambulatory Visit: Payer: Self-pay

## 2022-03-09 ENCOUNTER — Emergency Department (HOSPITAL_BASED_OUTPATIENT_CLINIC_OR_DEPARTMENT_OTHER)
Admission: EM | Admit: 2022-03-09 | Discharge: 2022-03-09 | Disposition: A | Discharge status: Home / Self Care | Payer: BC Managed Care – PPO | Attending: Emergency Medicine | Admitting: Emergency Medicine

## 2022-03-09 DIAGNOSIS — R42 Dizziness and giddiness: Secondary | ICD-10-CM | POA: Diagnosis not present

## 2022-03-09 DIAGNOSIS — W010XXA Fall on same level from slipping, tripping and stumbling without subsequent striking against object, initial encounter: Secondary | ICD-10-CM | POA: Diagnosis not present

## 2022-03-09 DIAGNOSIS — S060X0A Concussion without loss of consciousness, initial encounter: Secondary | ICD-10-CM

## 2022-03-09 DIAGNOSIS — M7989 Other specified soft tissue disorders: Secondary | ICD-10-CM | POA: Diagnosis not present

## 2022-03-09 DIAGNOSIS — S8002XA Contusion of left knee, initial encounter: Secondary | ICD-10-CM | POA: Diagnosis not present

## 2022-03-09 DIAGNOSIS — S40011A Contusion of right shoulder, initial encounter: Secondary | ICD-10-CM | POA: Insufficient documentation

## 2022-03-09 DIAGNOSIS — Z87891 Personal history of nicotine dependence: Secondary | ICD-10-CM | POA: Insufficient documentation

## 2022-03-09 DIAGNOSIS — Y9301 Activity, walking, marching and hiking: Secondary | ICD-10-CM | POA: Insufficient documentation

## 2022-03-09 DIAGNOSIS — S0990XA Unspecified injury of head, initial encounter: Secondary | ICD-10-CM | POA: Diagnosis not present

## 2022-03-09 DIAGNOSIS — W19XXXA Unspecified fall, initial encounter: Secondary | ICD-10-CM | POA: Diagnosis not present

## 2022-03-09 DIAGNOSIS — S0181XA Laceration without foreign body of other part of head, initial encounter: Secondary | ICD-10-CM

## 2022-03-09 DIAGNOSIS — S0083XA Contusion of other part of head, initial encounter: Secondary | ICD-10-CM

## 2022-03-09 DIAGNOSIS — S01111A Laceration without foreign body of right eyelid and periocular area, initial encounter: Secondary | ICD-10-CM | POA: Diagnosis not present

## 2022-03-09 DIAGNOSIS — Y92480 Sidewalk as the place of occurrence of the external cause: Secondary | ICD-10-CM | POA: Insufficient documentation

## 2022-03-09 DIAGNOSIS — G4489 Other headache syndrome: Secondary | ICD-10-CM | POA: Diagnosis not present

## 2022-03-09 MED ORDER — OXYCODONE-ACETAMINOPHEN 5-325 MG PO TABS
1.0000 | ORAL_TABLET | Freq: Once | ORAL | Status: AC
  Administered 2022-03-09: 1 via ORAL
  Filled 2022-03-09: qty 1

## 2022-03-09 MED ORDER — LIDOCAINE-EPINEPHRINE (PF) 2 %-1:200000 IJ SOLN
10.0000 mL | Freq: Once | INTRAMUSCULAR | Status: AC
  Administered 2022-03-09: 10 mL
  Filled 2022-03-09: qty 20

## 2022-03-09 NOTE — ED Provider Notes (Signed)
[facility] EMERGENCY DEPARTMENT Provider Note   CSN: [id number] Arrival date & time: 03/09/22  1217     History  Chief Complaint  Patient presents with   Marletta Lor    Ashley Schultz is a 66 y.o. female who presents to the ED for a mechanical, ground-level fall that occurred earlier today.  Patient states that she was walking around the block when she tripped over an uneven piece of pavement.  She attempted to catch herself with her outstretched hands but caught the brunt of her fall on her forehead.  No loss of consciousness.  She is not on blood thinners  She endorses a headache, dizziness when standing.  She denies vision changes and vomiting although she reports feeling nauseous at the time of impact.  She has a laceration to her right eyebrow.  She is accompanied by husband who the denies changes in mental status.  Denies neck pain and other areas of injury or discomfort.   Fall       Home Medications Prior to Admission medications   Medication Sig Start Date End Date Taking? Authorizing Provider  Ascorbic Acid (VITAMIN C PO) Take by mouth.    [provider]  CALCIUM PO Take by mouth.    [provider]  Cetirizine HCl (ZYRTEC PO) Take by mouth.    [provider]  Cholecalciferol (VITAMIN D PO) Take by mouth.    [provider]  ibuprofen (ADVIL) 800 MG tablet Take 1 tablet (800 mg total) by mouth every 8 (eight) hours as needed. 06/07/20   Wyline Beady A, NP  IRON PO Take by mouth.    [provider]  Multiple Vitamin (MULTIVITAMIN) capsule Take 1 capsule by mouth daily.    [provider]  pantoprazole (PROTONIX) 40 MG tablet Take 40 mg by mouth daily.    [provider]  VITAMIN E PO Take by mouth.    [provider]      Allergies    Codeine, Darvon [propoxyphene hcl], and Penicillins    Review of Systems   Review of Systems  Physical Exam Updated Vital Signs BP (!) 135/91  (BP Location: Right Arm)   Pulse 70   Temp (!) 97.4 F (36.3 C)   Resp 17   Ht 5' 7.5" (1.715 m)   Wt 72.6 kg   SpO2 99%   BMI 24.69 kg/m  Physical Exam Vitals and nursing note reviewed.  Constitutional:      General: She is not in acute distress.    Appearance: She is not ill-appearing.  HENT:     Head: Atraumatic.     Comments: 2 cm linear laceration above the right eyebrow.  Negative battle sign, negative raccoon eyes.  No obvious deformity Eyes:     Conjunctiva/sclera: Conjunctivae normal.  Cardiovascular:     Rate and Rhythm: Normal rate and regular rhythm.     Pulses: Normal pulses.     Heart sounds: No murmur heard. Pulmonary:     Effort: Pulmonary effort is normal. No respiratory distress.     Breath sounds: Normal breath sounds.  Abdominal:     General: Abdomen is flat. There is no distension.     Palpations: Abdomen is soft.     Tenderness: There is no abdominal tenderness.  Musculoskeletal:        General: Normal range of motion.     Cervical back: Normal range of motion.     Comments: Bruise noted to right lateral  shoulder and left anterior knee.  Full range of motion, nontender to palpation.  Skin:    General: Skin is warm and dry.     Capillary Refill: Capillary refill takes less than 2 seconds.  Neurological:     General: No focal deficit present.     Mental Status: She is alert.  Psychiatric:        Mood and Affect: Mood normal.      ED Results / Procedures / Treatments   Labs (all labs ordered are listed, but only abnormal results are displayed) Labs Reviewed - No data to display  EKG None  Radiology CT Head Wo Contrast  Result Date: 03/09/2022 CLINICAL DATA:  Head injury EXAM: CT HEAD WITHOUT CONTRAST TECHNIQUE: Contiguous axial images were obtained from the base of the skull through the vertex without intravenous contrast. RADIATION DOSE REDUCTION: This exam was performed according to the departmental dose-optimization program which  includes automated exposure control, adjustment of the mA and/or kV according to patient size and/or use of iterative reconstruction technique. COMPARISON:  CT head 01/08/2017 FINDINGS: Brain: No acute intracranial hemorrhage, mass effect, or herniation. No extra-axial fluid collections. No evidence of acute territorial infarct. No hydrocephalus. Vascular: No hyperdense vessel or unexpected calcification. Skull: Normal. Negative for fracture or focal lesion. Sinuses/Orbits: No acute process identified. Other: Soft tissue injury and laceration in the right periorbital region. IMPRESSION: 1. No acute intracranial process identified. 2. Soft tissue injury and laceration in the right periorbital region. Electronically Signed   By: Jannifer Hick M.D.   On: 03/09/2022 13:46   CT Maxillofacial Wo Contrast  Result Date: 03/09/2022 CLINICAL DATA:  Facial trauma EXAM: CT MAXILLOFACIAL WITHOUT CONTRAST TECHNIQUE: Multidetector CT imaging of the maxillofacial structures was performed. Multiplanar CT image reconstructions were also generated. RADIATION DOSE REDUCTION: This exam was performed according to the departmental dose-optimization program which includes automated exposure control, adjustment of the mA and/or kV according to patient size and/or use of iterative reconstruction technique. COMPARISON:  None Available. FINDINGS: Osseous: No acute fracture or dislocation identified. Orbits: Globes and orbits appear within normal limits. Sinuses: Mild-to-moderate chronic mucosal thickening in the ethmoid sinuses and left maxillary sinus. No air-fluid levels. Soft tissues: Soft tissue edema and injury identified in the right periorbital region including a deep laceration. Limited intracranial: No acute process identified. IMPRESSION: 1. No acute fracture identified. 2. Right periorbital soft tissue swelling and injury including a deep laceration. Electronically Signed   By: Jannifer Hick M.D.   On: 03/09/2022 13:45     Procedures .Marland KitchenLaceration Repair  Date/Time: 03/09/2022 3:39 PM  Performed by: Janell Quiet, PA-C Authorized by: Janell Quiet, PA-C   Consent:    Consent obtained:  Verbal   Consent given by:  Patient   Risks discussed:  Infection, need for additional repair, pain, poor cosmetic result and poor wound healing   Alternatives discussed:  No treatment and delayed treatment Universal protocol:    Procedure explained and questions answered to patient or proxy's satisfaction: yes     Relevant documents present and verified: yes     Test results available: yes     Imaging studies available: yes     Required blood products, implants, devices, and special equipment available: yes     Site/side marked: yes     Immediately prior to procedure, a time out was called: yes     Patient identity confirmed:  Verbally with patient Anesthesia:    Anesthesia method:  Local infiltration  Local anesthetic:  Lidocaine 2% WITH epi Laceration details:    Location:  Face   Face location:  R eyebrow   Length (cm):  2   Depth (mm):  4 Pre-procedure details:    Preparation:  Imaging obtained to evaluate for foreign bodies Exploration:    Limited defect created (wound extended): no     Imaging obtained: x-ray   Treatment:    Area cleansed with:  Povidone-iodine   Amount of cleaning:  Standard   Irrigation solution:  Sterile water   Irrigation volume:    Irrigation method:  Syringe   Visualized foreign bodies/material removed: no     Debridement:  None   Undermining:  None   Scar revision: no   Skin repair:    Repair method:  Sutures   Suture size:  6-0   Suture material:  Prolene   Suture technique:  Simple interrupted   Number of sutures:  4 Approximation:    Approximation:  Close Repair type:    Repair type:  Simple Post-procedure details:    Dressing:  Antibiotic ointment and non-adherent dressing   Procedure completion:  Tolerated     Medications Ordered in  ED Medications  oxyCODONE-acetaminophen (PERCOCET/ROXICET) 5-325 MG per tablet 1 tablet (1 tablet Oral Given 03/09/22 1326)  lidocaine-EPINEPHrine (XYLOCAINE W/EPI) 2 %-1:200000 (PF) injection 10 mL (10 mLs Infiltration Given 03/09/22 1326)    ED Course/ Medical Decision Making/ A&P                           Medical Decision Making Amount and/or Complexity of Data Reviewed Radiology: ordered.  Risk Prescription drug management.   Social determinants of health:  Social History   Socioeconomic History   Marital status: Married    Spouse name: Not on file   Number of children: Not on file   Years of education: Not on file   Highest education level: Not on file  Occupational History   Not on file  Tobacco Use   Smoking status: Former   Smokeless tobacco: Never  Vaping Use   Vaping Use: Never used  Substance and Sexual Activity   Alcohol use: Yes    Alcohol/week: 3.0 standard drinks of alcohol    Types: 3 Standard drinks or equivalent per week   Drug use: No   Sexual activity: Yes    Birth control/protection: Post-menopausal    Comment: 1st intercourse 86 yo-5 partners  Other Topics Concern   Not on file  Social History Narrative   Not on file   Social Determinants of Health   Financial Resource Strain: Not on file  Food Insecurity: Not on file  Transportation Needs: Not on file  Physical Activity: Not on file  Stress: Not on file  Social Connections: Not on file  Intimate Partner Violence: Not on file     Initial impression:  This patient presents to the ED for concern of head injury after fall, this involves an extensive number of treatment options, and is a complaint that carries with it a high risk of complications and morbidity.   Differentials include facial fracture, intracranial bleed, wound.   Comorbidities affecting care:  None  Additional history obtained: Husband   Imaging Studies ordered:  I ordered imaging studies including  CT head  normal CT maxillofacial without fractures I independently visualized and interpreted imaging and I agree with the radiologist interpretation.   Medicines ordered and prescription drug management:  I ordered  medication including: Percocet x1 Reevaluation of the patient after these medicines showed that the patient improved I have reviewed the patients home medicines and have made adjustments as needed   ED Course/Re-evaluation: 66 year old female presents to the emergency department for evaluation of a laceration to her right eyebrow after a ground-level fall.  Vitals without significant abnormality.  Physical exam findings as described above.  I obtained imaging which was normal.  Patient's symptoms consistent with concussion.  Laceration was repaired as described in procedure above.  Discussed at home wound care.  Patient expresses understanding and is amenable to plan.  Disposition:  After consideration of the diagnostic results, physical exam, history and the patients response to treatment feel that the patent would benefit from discharge.   Contusion of face Face laceration: Plan and management as described above. Discharged home in good condition.  Final Clinical Impression(s) / ED Diagnoses Final diagnoses:  Contusion of face, initial encounter  Facial laceration, initial encounter    Rx / DC Orders ED Discharge Orders     None         Janell Quiet, PA-C 03/09/22 1543    Alvira Monday, MD 03/09/22 2035

## 2022-03-09 NOTE — Discharge Instructions (Signed)
1. Medications: Tylenol or ibuprofen for pain, usual home medications 2. Treatment: ice for swelling, keep wound clean with warm soap and water and keep bandage dry, do not submerge in water for 24 hours 3. Follow Up: Please return or follow up with primary care provider in 7 days to have your stitches/staples removed or sooner if you have concerns. Return to the emergency department for increased redness, drainage of pus from the wound, or fevers/chills.   WOUND CARE  Keep area clean and dry for 24 hours. Do not remove bandage, if applied.  After 24 hours, remove bandage and wash wound gently with mild soap and warm water. Reapply a new bandage after cleaning wound, if directed.   Continue daily cleansing with soap and water until stitches/staples are removed.  Do not apply any ointments or creams to the wound while stitches/staples are in place, as this may cause delayed healing. Return if you experience any of the following signs of infection: Swelling, redness, pus drainage, streaking, fever >101.0 F  Return if you experience excessive bleeding that does not stop after 15-20 minutes of constant, firm pressure.  

## 2022-03-09 NOTE — ED Notes (Signed)
Pt to ED via [facility] s/p fall on asphalt, hitting head; no LOC, no thinners

## 2022-03-09 NOTE — ED Triage Notes (Signed)
Pt BIB [facility], endorses mechanical fall while walking. Pt c/o HA, lac noted above right eye, bleeding controlled. Pt denies loc, denies thinners. Pt denies neck pain or blurred vision

## 2022-03-09 NOTE — ED Notes (Signed)
Returned from CT.

## 2022-03-19 DIAGNOSIS — S01111D Laceration without foreign body of right eyelid and periocular area, subsequent encounter: Secondary | ICD-10-CM | POA: Diagnosis not present

## 2022-03-20 DIAGNOSIS — H43391 Other vitreous opacities, right eye: Secondary | ICD-10-CM | POA: Diagnosis not present

## 2022-03-20 DIAGNOSIS — H538 Other visual disturbances: Secondary | ICD-10-CM | POA: Diagnosis not present

## 2022-03-20 DIAGNOSIS — Z9181 History of falling: Secondary | ICD-10-CM | POA: Diagnosis not present

## 2022-06-24 DIAGNOSIS — H00022 Hordeolum internum right lower eyelid: Secondary | ICD-10-CM | POA: Diagnosis not present

## 2022-08-01 DIAGNOSIS — R1030 Lower abdominal pain, unspecified: Secondary | ICD-10-CM | POA: Diagnosis not present

## 2022-08-01 DIAGNOSIS — U071 COVID-19: Secondary | ICD-10-CM | POA: Diagnosis not present

## 2022-08-01 DIAGNOSIS — R051 Acute cough: Secondary | ICD-10-CM | POA: Diagnosis not present

## 2022-08-01 DIAGNOSIS — M791 Myalgia, unspecified site: Secondary | ICD-10-CM | POA: Diagnosis not present

## 2022-08-25 NOTE — Progress Notes (Unsigned)
   Ashley Schultz 1956-05-07 [id number]   History:  66 y.o. [id number] presents for annual exam. Postmenopausal - no HRT, no bleeding. 1992 LEEP, subsequent paps normal.   Gynecologic History No LMP recorded. Patient is postmenopausal.   Contraception/Family planning: post menopausal status Sexually active: Yes  Health Maintenance Last Pap: 08/11/2018. Results were: Normal, 5-year repeat Last mammogram: 08/21/2021. Results were: Normal. Scheduled tomorrow Last colonoscopy: 2013. Results were: Normal, 10-year recall Last Dexa: 11/20/2020. Results were: T-score -1.8, FRAX 9.5% / 1.1%  Past medical history, past surgical history, family history and social history were all reviewed and documented in the EPIC chart. Married. Building control surveyor. 2 daughters, 2 granddaughters ages 23 and 42.   ROS:  A ROS was performed and pertinent positives and negatives are included.  Exam:  Vitals:   08/26/22 0807  BP: 118/78  Pulse: 88  SpO2: 94%  Weight: 158 lb (71.7 kg)  Height: 5' 7.5" (1.715 m)    Body mass index is 24.38 kg/m.  General appearance:  Normal Thyroid:  Symmetrical, normal in size, without palpable masses or nodularity. Respiratory  Auscultation:  Clear without wheezing or rhonchi Cardiovascular  Auscultation:  Regular rate, without rubs, murmurs or gallops  Edema/varicosities:  Not grossly evident Abdominal  Soft,nontender, without masses, guarding or rebound.  Liver/spleen:  No organomegaly noted  Hernia:  None appreciated  Skin  Inspection:  Grossly normal Breasts: Examined lying and sitting.   Right: Without masses, retractions, nipple discharge or axillary adenopathy.   Left: Without masses, retractions, nipple discharge or axillary adenopathy. Genitourinary   Inguinal/mons:  Normal without inguinal adenopathy  External genitalia:  Normal appearing vulva with no masses, tenderness, or lesions  BUS/Urethra/Skene's glands:  Normal  Vagina:  Normal appearing with  normal color and discharge, no lesions. Atrophic changes  Cervix:  Normal appearing without discharge or lesions  Uterus:  Normal in size, shape and contour.  Midline and mobile, nontender  Adnexa/parametria:     Rt: Normal in size, without masses or tenderness.   Lt: Normal in size, without masses or tenderness.  Anus and perineum: Normal  Digital rectal exam: Normal sphincter tone without palpated masses or tenderness  Patient informed chaperone available to be present for breast and pelvic exam. Patient has requested no chaperone to be present. Patient has been advised what will be completed during breast and pelvic exam.   Assessment/Plan:  66 y.o. [id number] for annual exam.   Well female exam with routine gynecological exam - Education provided on SBEs, importance of preventative screenings, current guidelines, high calcium diet, regular exercise, and multivitamin daily.  Labs with PCP.   Postmenopausal - no HRT, no bleeding.   Osteopenia of multiple sites - T-score - 1.8 without elevated FRAX. Continue Vitamin D + calcium supplement and increase exercise, incorporating resistance training. Will schedule DXA in March.   Screening for cervical cancer - 1992 LEEP, subsequent paps normal. Will repeat at 5-year interval per guidelines.  Screening for breast cancer - Normal mammogram history.  Continue annual screenings.  Normal breast exam today.   Screening for colon cancer - 2013 colonoscopy. Was scheduled for colonoscopy in October but she had to cancel due to covid infection. Plans to reschedule for January.   Return in 1 year for annual.      Olivia Mackie DNP, 8:47 AM 08/26/2022

## 2022-08-26 ENCOUNTER — Ambulatory Visit (INDEPENDENT_AMBULATORY_CARE_PROVIDER_SITE_OTHER): Payer: BC Managed Care – PPO | Admitting: Nurse Practitioner

## 2022-08-26 ENCOUNTER — Encounter: Payer: Self-pay | Admitting: Nurse Practitioner

## 2022-08-26 VITALS — BP 118/78 | HR 88 | Ht 67.5 in | Wt 158.0 lb

## 2022-08-26 DIAGNOSIS — Z780 Asymptomatic menopausal state: Secondary | ICD-10-CM | POA: Diagnosis not present

## 2022-08-26 DIAGNOSIS — M8589 Other specified disorders of bone density and structure, multiple sites: Secondary | ICD-10-CM

## 2022-08-26 DIAGNOSIS — Z01419 Encounter for gynecological examination (general) (routine) without abnormal findings: Secondary | ICD-10-CM

## 2022-08-27 DIAGNOSIS — Z1231 Encounter for screening mammogram for malignant neoplasm of breast: Secondary | ICD-10-CM | POA: Diagnosis not present

## 2022-08-29 ENCOUNTER — Encounter: Payer: Self-pay | Admitting: Nurse Practitioner

## 2022-09-08 DIAGNOSIS — R92322 Mammographic fibroglandular density, left breast: Secondary | ICD-10-CM | POA: Diagnosis not present

## 2022-09-10 ENCOUNTER — Encounter: Payer: Self-pay | Admitting: Nurse Practitioner

## 2022-12-09 ENCOUNTER — Ambulatory Visit (INDEPENDENT_AMBULATORY_CARE_PROVIDER_SITE_OTHER): Payer: BC Managed Care – PPO

## 2022-12-09 ENCOUNTER — Other Ambulatory Visit: Payer: Self-pay | Admitting: Nurse Practitioner

## 2022-12-09 DIAGNOSIS — Z13820 Encounter for screening for osteoporosis: Secondary | ICD-10-CM | POA: Diagnosis not present

## 2022-12-09 DIAGNOSIS — Z780 Asymptomatic menopausal state: Secondary | ICD-10-CM

## 2022-12-09 DIAGNOSIS — M810 Age-related osteoporosis without current pathological fracture: Secondary | ICD-10-CM

## 2022-12-09 DIAGNOSIS — M8589 Other specified disorders of bone density and structure, multiple sites: Secondary | ICD-10-CM

## 2022-12-09 DIAGNOSIS — Z01419 Encounter for gynecological examination (general) (routine) without abnormal findings: Secondary | ICD-10-CM

## 2022-12-23 ENCOUNTER — Ambulatory Visit: Payer: BC Managed Care – PPO | Admitting: Obstetrics & Gynecology

## 2022-12-23 ENCOUNTER — Encounter: Payer: Self-pay | Admitting: Obstetrics & Gynecology

## 2022-12-23 VITALS — BP 120/78 | HR 64

## 2022-12-23 DIAGNOSIS — M816 Localized osteoporosis [Lequesne]: Secondary | ICD-10-CM | POA: Diagnosis not present

## 2022-12-23 NOTE — Progress Notes (Signed)
    Ashley Schultz November 29, 1955 [id number]        67 y.o.  [id number]   RP: Counseling and management of Osteoporosis  HPI: Bone density 12/09/22 showing Osteoporosis at the AP Spine.  Physically active, walking and playing tennis.  No h/o fragility fracture.  Taking Vit D 1000 IU daily.  No Ca++ supplement currently and doesn't like milk products.  Fam h/o Osteoporosis.     OB History  Gravida Para Term Preterm AB Living  4 2 2   2 2   SAB IAB Ectopic Multiple Live Births               # Outcome Date GA Lbr Len/2nd Weight Sex Delivery Anes PTL Lv  4 AB           3 AB           2 Term           1 Term             Past medical history,surgical history, problem list, medications, allergies, family history and social history were all reviewed and documented in the EPIC chart.   Directed ROS with pertinent positives and negatives documented in the history of present illness/assessment and plan.  Exam:  Vitals:   12/23/22 1005  BP: 120/78  Pulse: 64  SpO2: 98%   General appearance:  Normal  Bone density 12/09/22: Osteoporosis T-Score -2.6 at the AP Spine, significant decrease by 3%.  Osteopenia at bilateral femoral necks.   Assessment/Plan:  67 y.o. [id number]   1. Localized osteoporosis without current pathological fracture Bone density 12/09/22 showing Osteoporosis at the AP Spine.  Physically active, walking and playing tennis.  No h/o fragility fracture.  Taking Vit D 1000 IU daily.  No Ca++ supplement currently and doesn't like milk products.  Fam h/o Osteoporosis.  Bone density 12/09/22: Osteoporosis T-Score -2.6 at the AP Spine, significant decrease by 3%.  Osteopenia at bilateral femoral necks.  Counseling done on Osteoporosis with the risks of fragility fracture.  Will check Vit D today.  Recommend supplements of Ca++ to reach 1.5 g/d total.  Vit K2 100 microgram daily also recommended.  Will increase exercises for her back specifically.  Declined bone medications after  thorough counseling.  Repeat BD in 2 years. - Vitamin D (25 hydroxy)   Princess Bruins MD, 10:23 AM 12/23/2022

## 2022-12-24 LAB — VITAMIN D 25 HYDROXY (VIT D DEFICIENCY, FRACTURES): Vit D, 25-Hydroxy: 37 ng/mL (ref 30–100)

## 2022-12-31 DIAGNOSIS — K644 Residual hemorrhoidal skin tags: Secondary | ICD-10-CM | POA: Diagnosis not present

## 2022-12-31 DIAGNOSIS — D120 Benign neoplasm of cecum: Secondary | ICD-10-CM | POA: Diagnosis not present

## 2022-12-31 DIAGNOSIS — K648 Other hemorrhoids: Secondary | ICD-10-CM | POA: Diagnosis not present

## 2022-12-31 DIAGNOSIS — Z1211 Encounter for screening for malignant neoplasm of colon: Secondary | ICD-10-CM | POA: Diagnosis not present

## 2022-12-31 DIAGNOSIS — Z83719 Family history of colon polyps, unspecified: Secondary | ICD-10-CM | POA: Diagnosis not present

## 2022-12-31 DIAGNOSIS — K5730 Diverticulosis of large intestine without perforation or abscess without bleeding: Secondary | ICD-10-CM | POA: Diagnosis not present

## 2023-09-04 ENCOUNTER — Encounter: Payer: Self-pay | Admitting: Nurse Practitioner

## 2023-11-12 ENCOUNTER — Ambulatory Visit: Payer: BC Managed Care – PPO | Admitting: Nurse Practitioner

## 2023-11-17 ENCOUNTER — Ambulatory Visit (INDEPENDENT_AMBULATORY_CARE_PROVIDER_SITE_OTHER): Payer: Medicare Other | Admitting: Nurse Practitioner

## 2023-11-17 ENCOUNTER — Other Ambulatory Visit (HOSPITAL_COMMUNITY)
Admission: RE | Admit: 2023-11-17 | Discharge: 2023-11-17 | Disposition: A | Discharge status: Home / Self Care | Payer: BLUE CROSS/BLUE SHIELD | Source: Ambulatory Visit | Attending: Nurse Practitioner | Admitting: Nurse Practitioner

## 2023-11-17 ENCOUNTER — Encounter: Payer: Self-pay | Admitting: Nurse Practitioner

## 2023-11-17 VITALS — BP 122/70 | HR 78 | Ht 67.0 in | Wt 166.0 lb

## 2023-11-17 DIAGNOSIS — Z01419 Encounter for gynecological examination (general) (routine) without abnormal findings: Secondary | ICD-10-CM

## 2023-11-17 DIAGNOSIS — Z124 Encounter for screening for malignant neoplasm of cervix: Secondary | ICD-10-CM

## 2023-11-17 DIAGNOSIS — M816 Localized osteoporosis [Lequesne]: Secondary | ICD-10-CM

## 2023-11-17 DIAGNOSIS — R8290 Unspecified abnormal findings in urine: Secondary | ICD-10-CM

## 2023-11-17 DIAGNOSIS — Z1151 Encounter for screening for human papillomavirus (HPV): Secondary | ICD-10-CM | POA: Diagnosis not present

## 2023-11-17 DIAGNOSIS — Z780 Asymptomatic menopausal state: Secondary | ICD-10-CM | POA: Diagnosis not present

## 2023-11-17 NOTE — Progress Notes (Signed)
 Ashley Schultz 04/29/1956 [id number]   History:  68 y.o. [id number] presents for annual exam. Has noticed intermittent urine odor. Denies other urinary symptoms or vaginal symptoms. Postmenopausal - no HRT, no bleeding. 1992 LEEP, subsequent paps normal. Osteoporosis in spine - optimizing Calcium and Vit D, not active in exercise. Plans to start walking again.   Gynecologic History No LMP recorded. Patient is postmenopausal.   Contraception/Family planning: post menopausal status Sexually active: Yes  Health Maintenance Last Pap: 08/11/2018. Results were: Normal neg HPV Last mammogram: 09/03/2023. Results were: Normal Last colonoscopy: 2024 at [facility]. Results were: Normal per patient Last Dexa: 12/09/2022. Results were: T-score -2.6 in spine, bil neck osteopenic Exercising: No. The patient does not participate in regular exercise at present. Some walking Smoker: no   Past medical history, past surgical history, family history and social history were all reviewed and documented in the EPIC chart. Married. Building control surveyor. 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   ROS:  A ROS was performed and pertinent positives and negatives are included.  Exam:  Vitals:   11/17/23 1109  BP: 122/70  Pulse: 78  SpO2: 100%  Weight: 166 lb (75.3 kg)  Height: 5\' 7"  (1.702 m)     Body mass index is 26 kg/m.  General appearance:  Normal Thyroid:  Symmetrical, normal in size, without palpable masses or nodularity. Respiratory  Auscultation:  Clear without wheezing or rhonchi Cardiovascular  Auscultation:  Regular rate, without rubs, murmurs or gallops  Edema/varicosities:  Not grossly evident Abdominal  Soft,nontender, without masses, guarding or rebound.  Liver/spleen:  No organomegaly noted  Hernia:  None appreciated  Skin  Inspection:  Grossly normal Breasts: Examined lying and sitting.   Right: Without masses, retractions, nipple discharge or axillary  adenopathy.   Left: Without masses, retractions, nipple discharge or axillary adenopathy. Pelvic: External genitalia:  no lesions              Urethra:  normal appearing urethra with no masses, tenderness or lesions              Bartholins and Skenes: normal                 Vagina: normal appearing vagina with normal color and discharge, no lesions. Atrophic changes              Cervix: no lesions Bimanual Exam:  Uterus:  no masses or tenderness              Adnexa: no mass, fullness, tenderness              Rectovaginal: Deferred              Anus:  normal, no lesions  Patient informed chaperone available to be present for breast and pelvic exam. Patient has requested no chaperone to be present. Patient has been advised what will be completed during breast and pelvic exam.  UA neg  leukocytes, neg nitrites, 1+ blood, yellow/clear. Microscopic: wbc 0-5, rbc 0-2, no bacteria, few amorphous sediment  Assessment/Plan:  67 y.o. [id number] for annual exam.   Well female exam with routine gynecological exam - Education provided on SBEs, importance of preventative screenings, current guidelines, high calcium diet, regular exercise, and multivitamin daily.  Labs with PCP.   Postmenopausal - no HRT, no bleeding.   Localized osteoporosis without current pathological fracture - 11/2022 T-score -2.6 in spine, bil necks osteopenic. optimizing Calcium and Vit D. Discussed importance of exercise, especially weight lifting.  Repeat DXA at 2 years.   Cervical cancer screening - Plan: Cytology - PAP( [place]). 1992 LEEP, subsequent paps normal. Pap today per guidelines.   Abnormal urine odor - Plan: Urinalysis,Complete w/RFL Culture. Unremarkable. Reflex culture pending.   Screening for breast cancer - Normal mammogram history.  Continue annual screenings.  Normal breast exam today.   Screening for colon cancer - 2024 colonoscopy. Will repeat at GI's recommended interval.   Return in about 2 years  (around 11/16/2025) for B&P.      Olivia Mackie DNP, 12:01 PM 11/17/2023

## 2023-11-18 LAB — CYTOLOGY - PAP
Comment: NEGATIVE
Diagnosis: NEGATIVE
High risk HPV: NEGATIVE

## 2023-11-19 ENCOUNTER — Encounter: Payer: Self-pay | Admitting: Nurse Practitioner

## 2023-11-19 LAB — URINALYSIS, COMPLETE W/RFL CULTURE
Bilirubin Urine: NEGATIVE
Glucose, UA: NEGATIVE
Hyaline Cast: NONE SEEN /LPF
Ketones, ur: NEGATIVE
Leukocyte Esterase: NEGATIVE
Nitrites, Initial: NEGATIVE
Protein, ur: NEGATIVE
Specific Gravity, Urine: 1.015 (ref 1.001–1.035)
pH: 7 (ref 5.0–8.0)

## 2023-11-19 LAB — URINE CULTURE
MICRO NUMBER:: 16125732
Result:: NO GROWTH
SPECIMEN QUALITY:: ADEQUATE

## 2023-11-19 LAB — CULTURE INDICATED

## 2024-04-25 IMAGING — CT CT MAXILLOFACIAL W/O CM
3 series · 16 of 47 positions shown, 19 images · non-contrast
Comparison: None Available.

CLINICAL DATA: Facial trauma



[Series 2: max soft · axial · 0.36mm/px · z∈[-396,-254]mm · 10 of 83 slices shown, 13 images]
[im 6/83  brain]
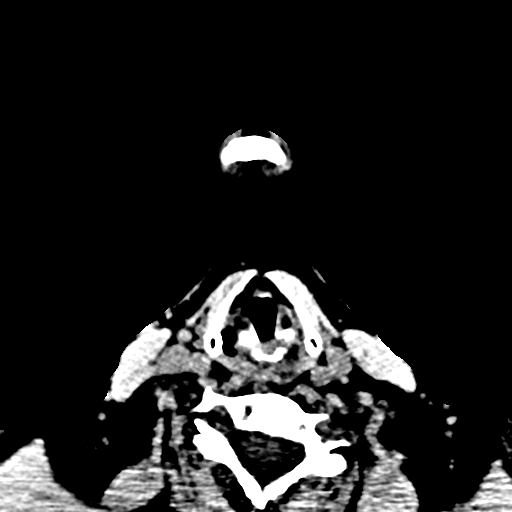
[im 6/83  bone]
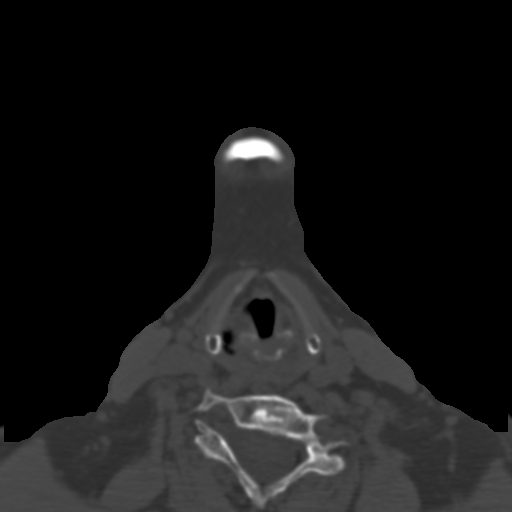
[im 15/83  bone]
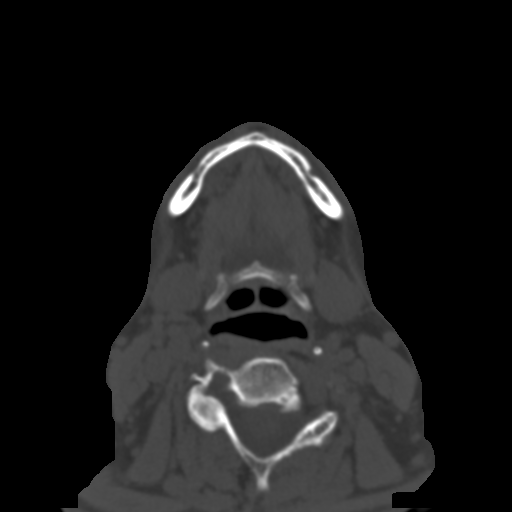
[im 23/83  bone]
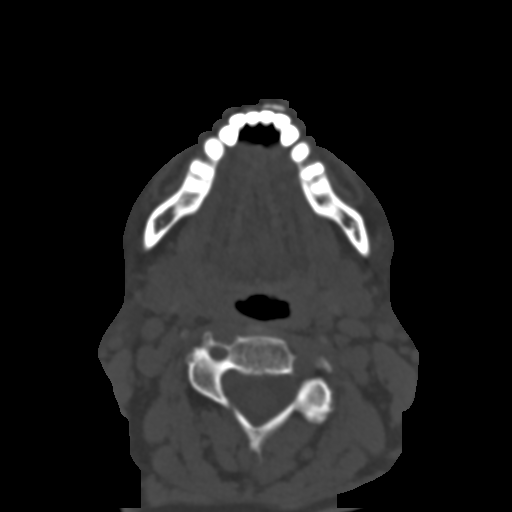
[im 29/83  bone]
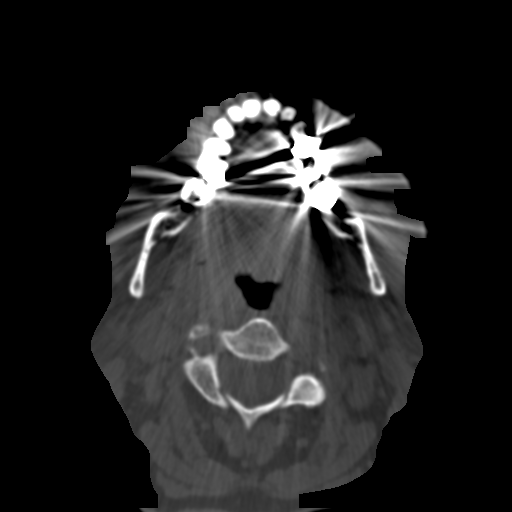
[im 37/83  brain]
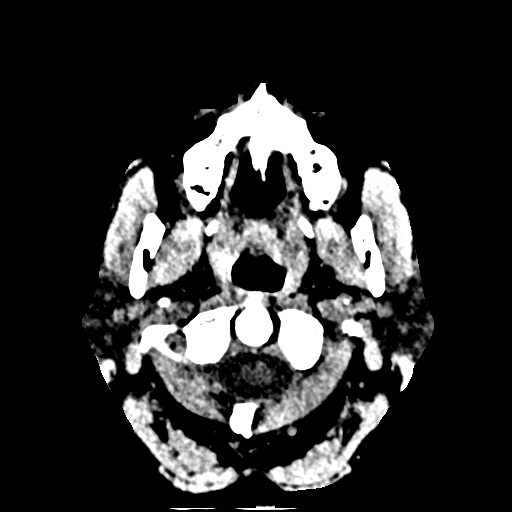
[im 37/83  bone]
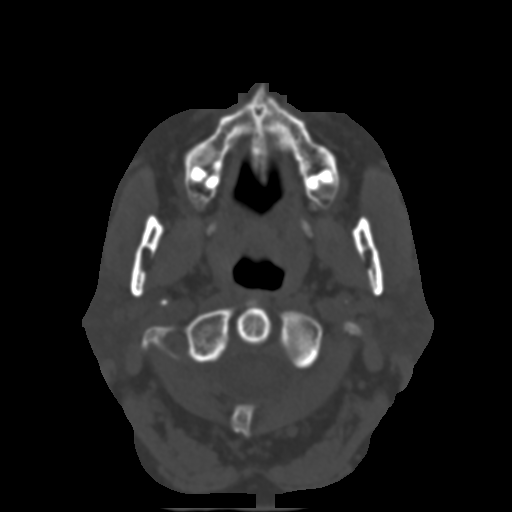
[im 46/83  bone]
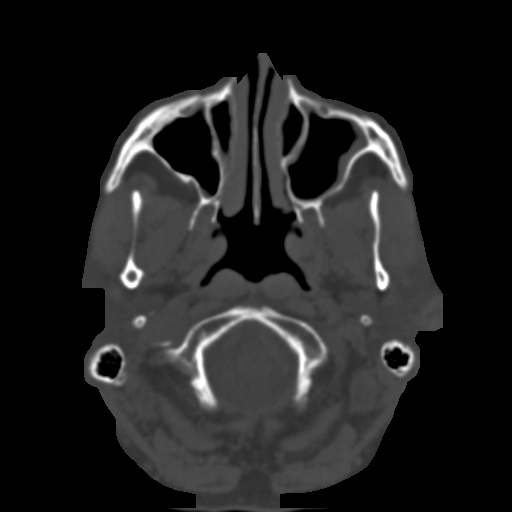
[im 54/83  bone]
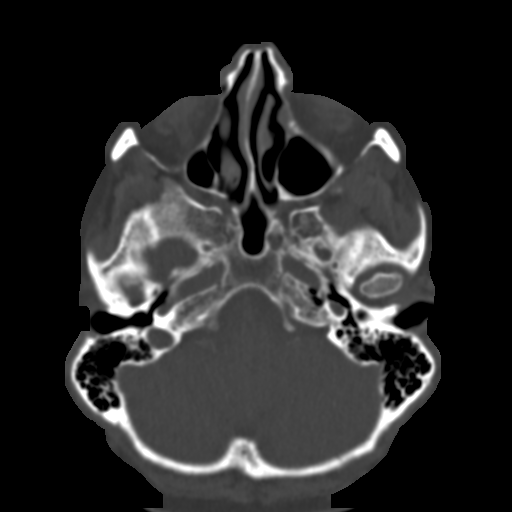
[im 63/83  bone]
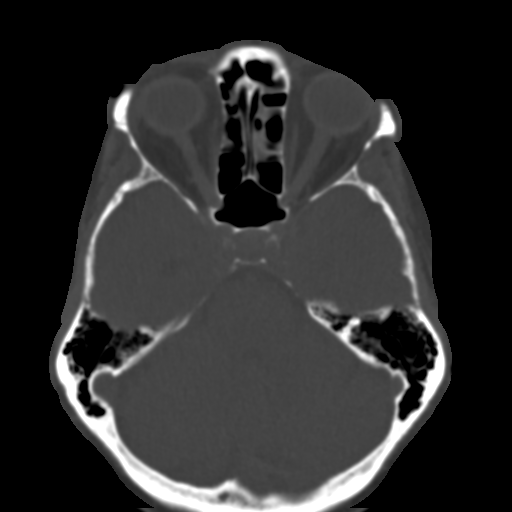
[im 68/83  brain]
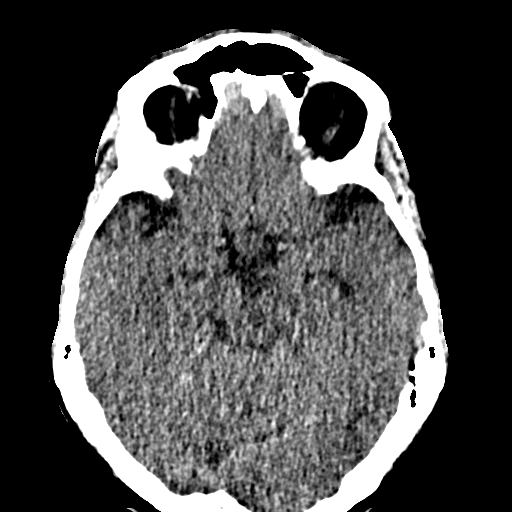
[im 68/83  bone]
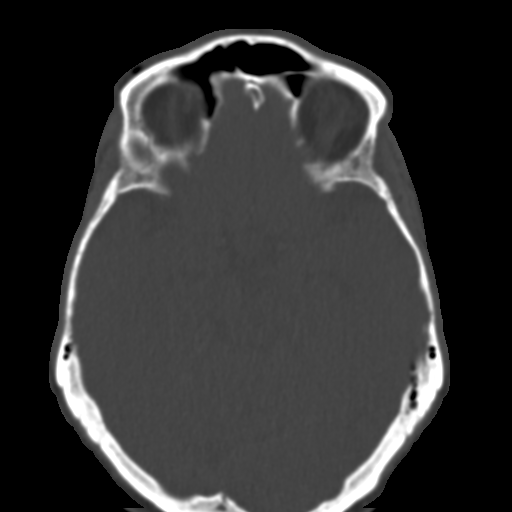
[im 77/83  bone]
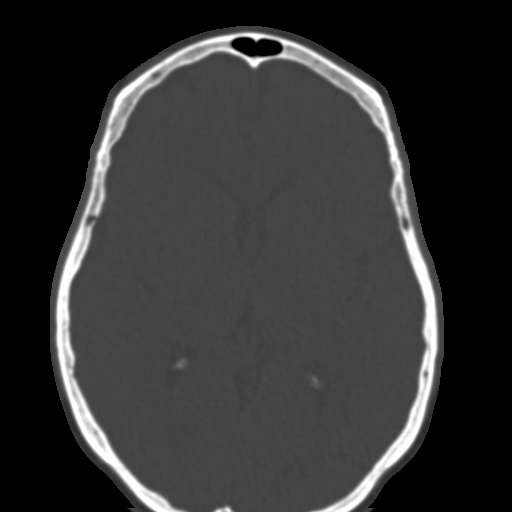

[Series 7: coronal soft · coronal · 0.37mm/px · 3 of 96 slices shown]
[im 32/96  bone]
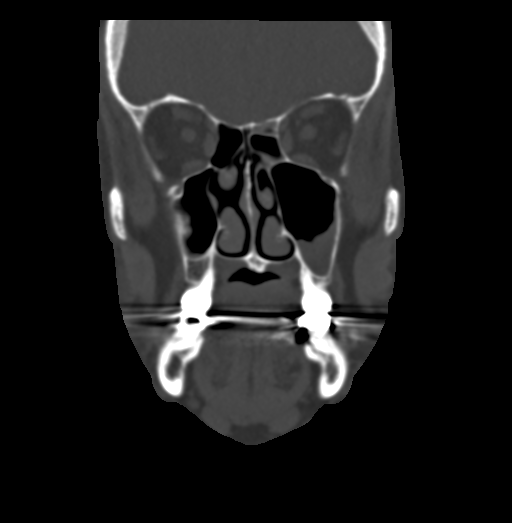
[im 43/96  bone]
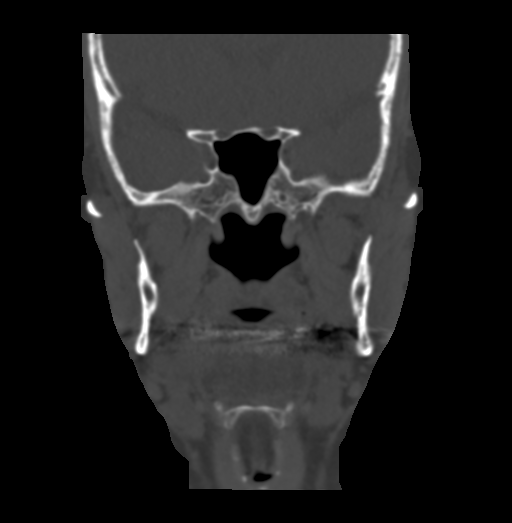
[im 53/96  bone]
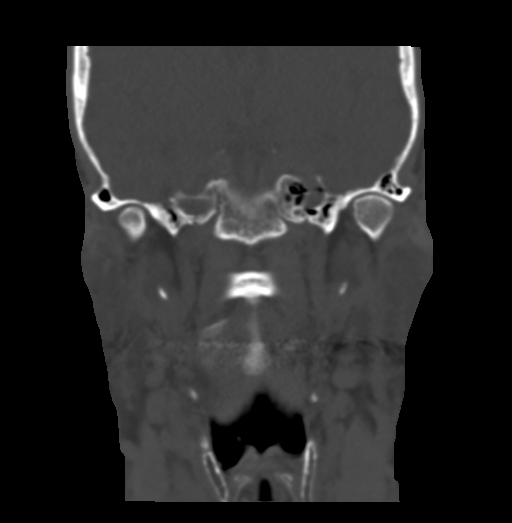

[Series 9: sagittal soft · sagittal · 0.38mm/px · 3 of 92 slices shown]
[im 31/92  bone]
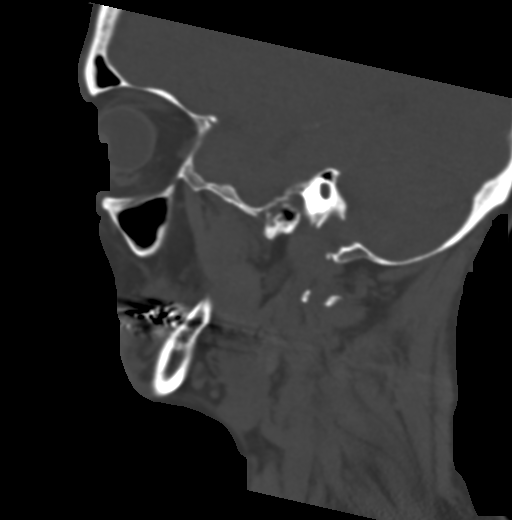
[im 46/92  bone]
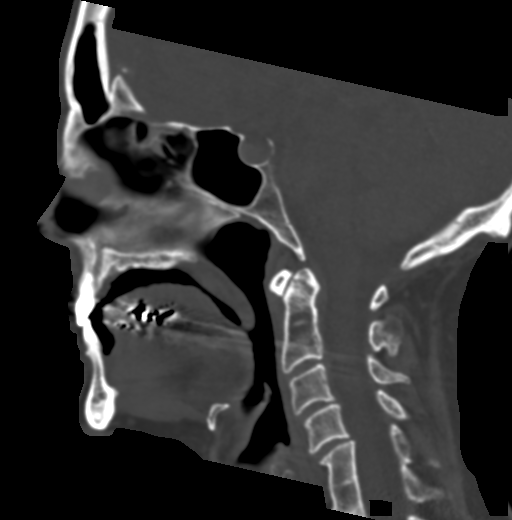
[im 61/92  bone]
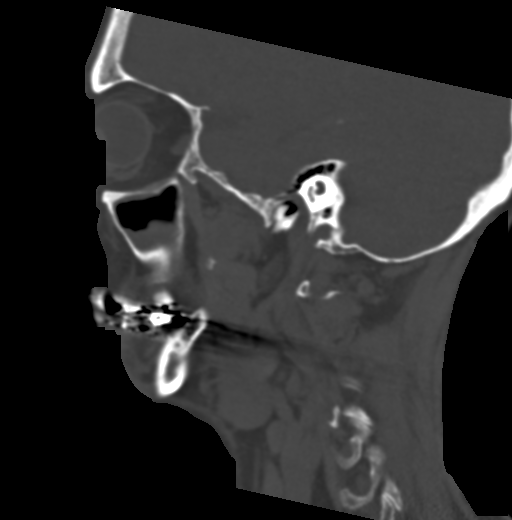

[16 of 47 positions shown; findings below may reference images not displayed]

FINDINGS: Osseous: No acute fracture or dislocation identified.

Orbits: Globes and orbits appear within normal limits.

Sinuses: Mild-to-moderate chronic mucosal thickening in the ethmoid
sinuses and left maxillary sinus. No air-fluid levels.

Soft tissues: Soft tissue edema and injury identified in the right
periorbital region including a deep laceration.

Limited intracranial: No acute process identified.
IMPRESSION: 1. No acute fracture identified.
2. Right periorbital soft tissue swelling and injury including a
deep laceration.

## 2024-04-25 IMAGING — CT CT HEAD W/O CM
3 series · 15 of 47 positions shown, 18 images · non-contrast
Comparison: CT head 01/08/2017

CLINICAL DATA: Head injury



[Series 2: head wo · axial · 0.42mm/px · z∈[-312,-186]mm · 9 of 31 slices shown, 12 images]
[im 3/31  brain]
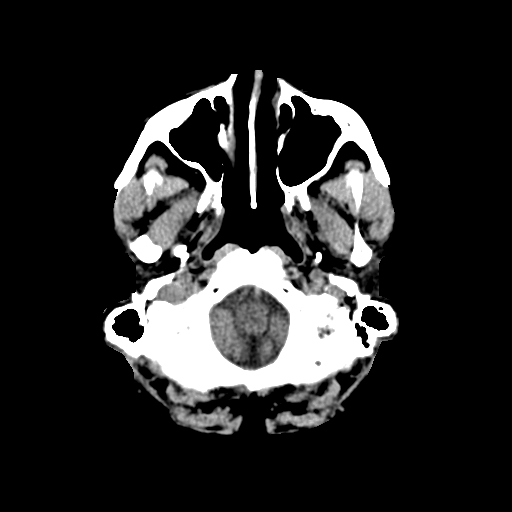
[im 3/31  bone]
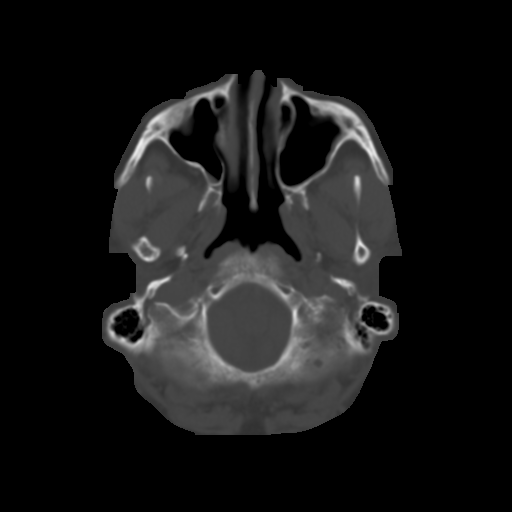
[im 6/31  brain]
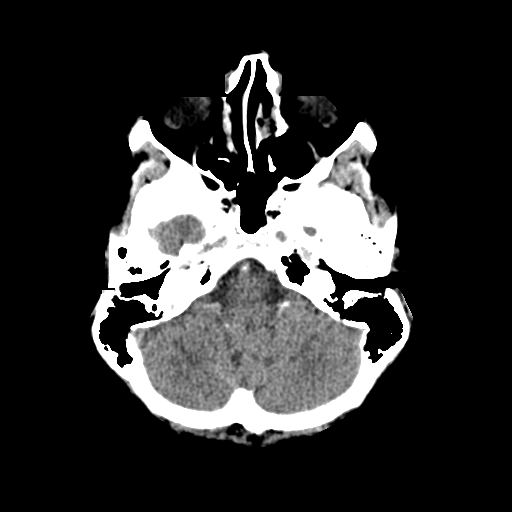
[im 9/31  brain]
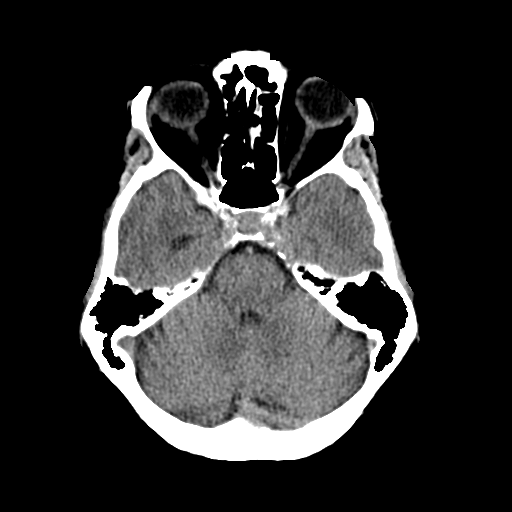
[im 12/31  brain]
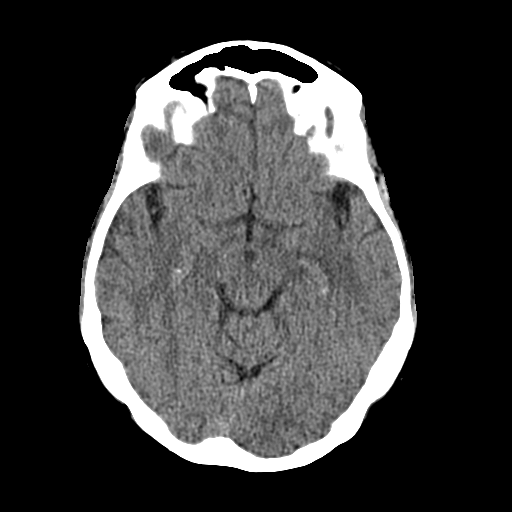
[im 16/31  brain]
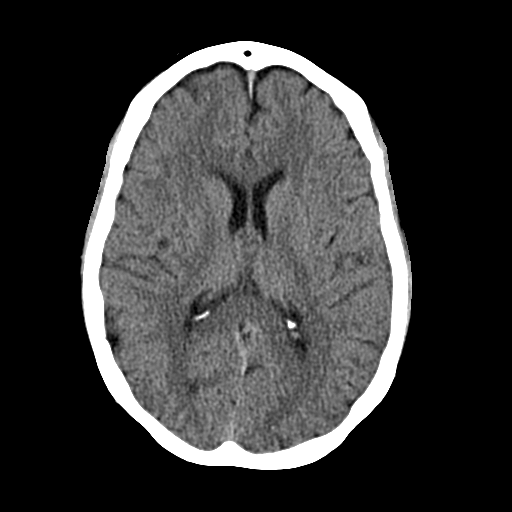
[im 16/31  bone]
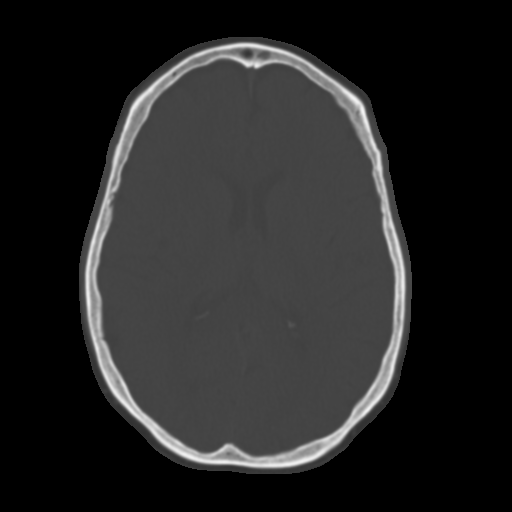
[im 19/31  brain]
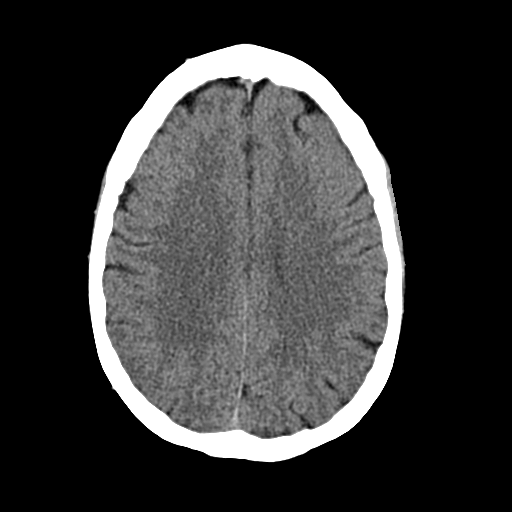
[im 22/31  brain]
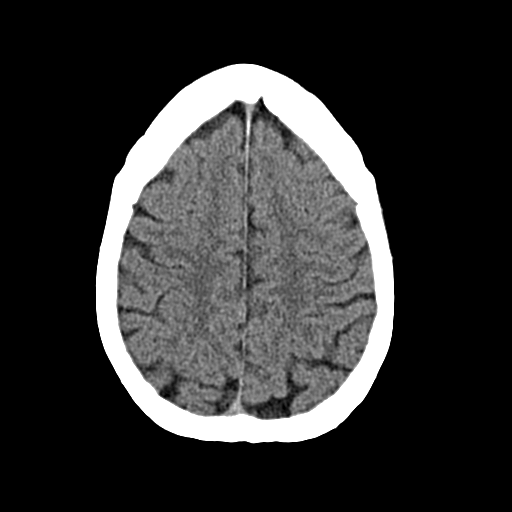
[im 25/31  brain]
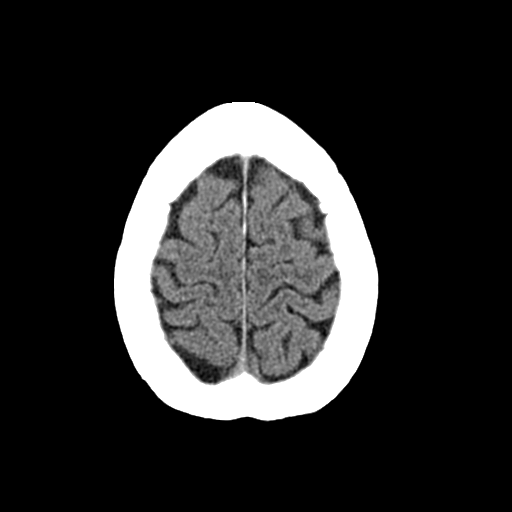
[im 28/31  brain]
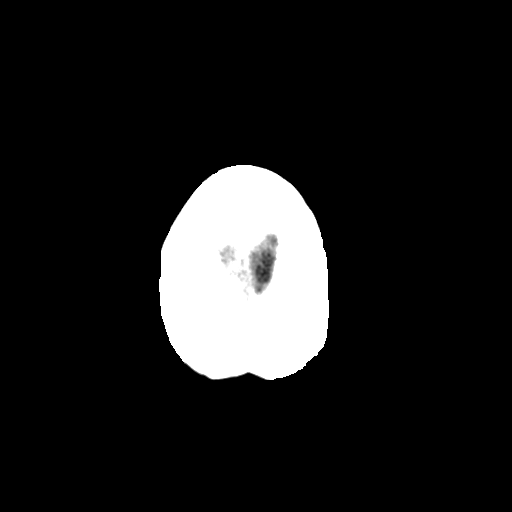
[im 28/31  bone]
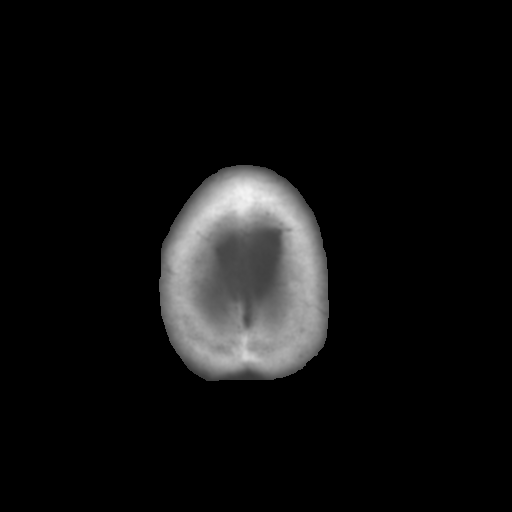

[Series 4: cor head wo · coronal · 0.31mm/px · 3 of 71 slices shown]
[im 24/71  brain]
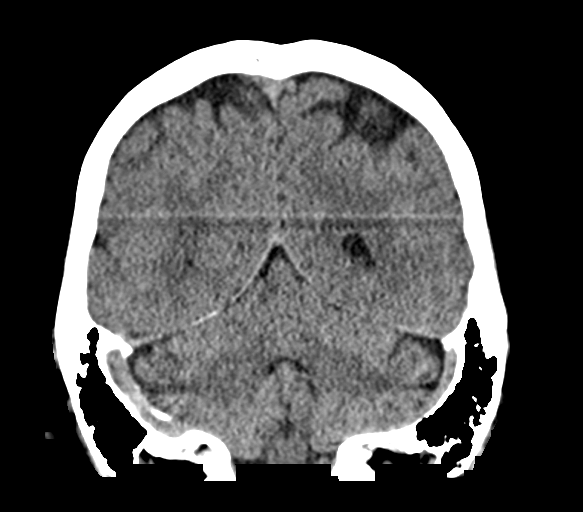
[im 32/71  brain]
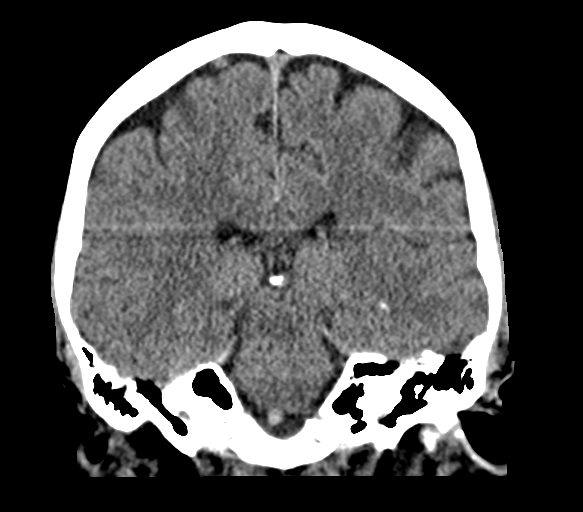
[im 39/71  brain]
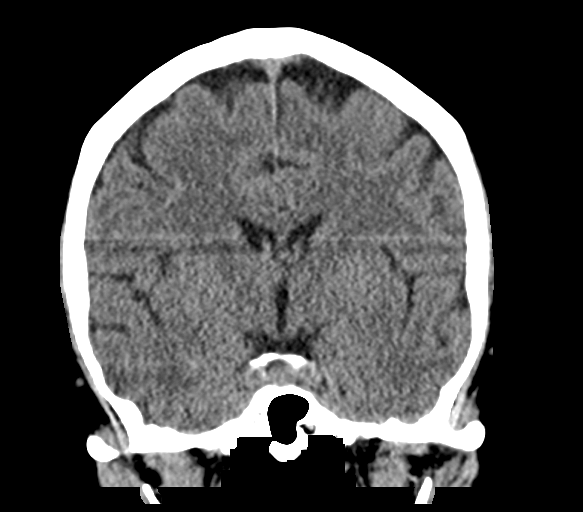

[Series 5: sag head wo · sagittal · 0.31mm/px · 3 of 55 slices shown]
[im 19/55  brain]
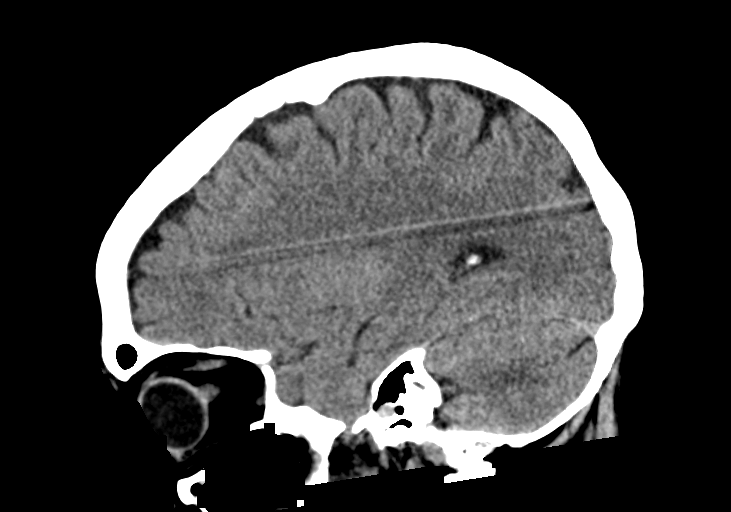
[im 28/55  brain]
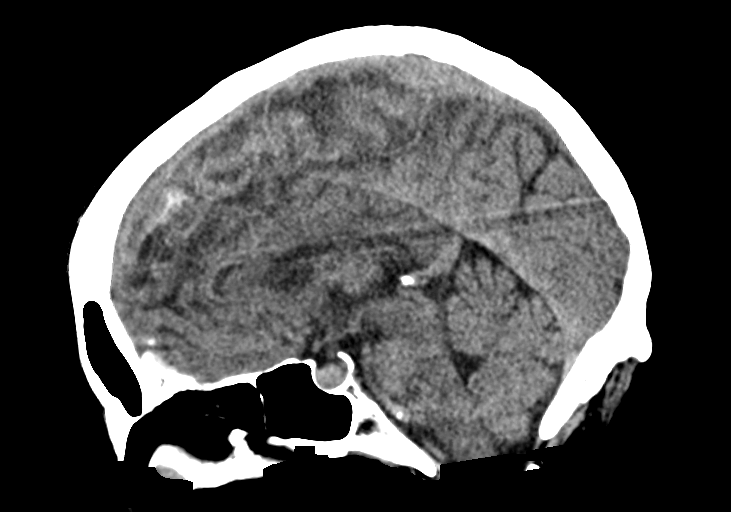
[im 37/55  brain]
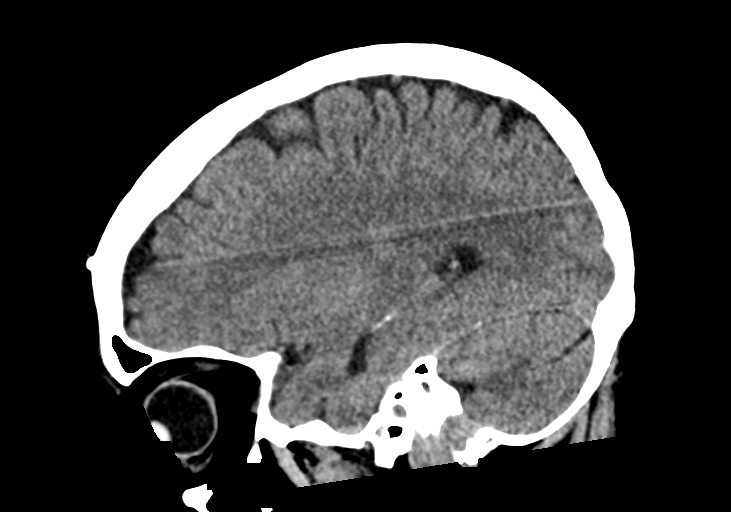

[15 of 47 positions shown; findings below may reference images not displayed]

FINDINGS: Brain: No acute intracranial hemorrhage, mass effect, or herniation.
No extra-axial fluid collections. No evidence of acute territorial
infarct. No hydrocephalus.

Vascular: No hyperdense vessel or unexpected calcification.

Skull: Normal. Negative for fracture or focal lesion.

Sinuses/Orbits: No acute process identified.

Other: Soft tissue injury and laceration in the right periorbital
region.
IMPRESSION: 1. No acute intracranial process identified.
2. Soft tissue injury and laceration in the right periorbital
region.

## 2024-09-08 LAB — HM MAMMOGRAPHY
